# Patient Record
Sex: Female | Born: 1990 | Race: Black or African American | Hispanic: No | Smoking: Never smoker
Health system: Southern US, Community
[De-identification: ages and names within clinical notes are randomized; demographics above are authoritative.]

## PROBLEM LIST (undated history)

## (undated) DIAGNOSIS — R5383 Other fatigue: Principal | ICD-10-CM

## (undated) DIAGNOSIS — D649 Anemia, unspecified: Secondary | ICD-10-CM

## (undated) DIAGNOSIS — A64 Unspecified sexually transmitted disease: Secondary | ICD-10-CM

## (undated) DIAGNOSIS — R87619 Unspecified abnormal cytological findings in specimens from cervix uteri: Secondary | ICD-10-CM

## (undated) DIAGNOSIS — N83209 Unspecified ovarian cyst, unspecified side: Secondary | ICD-10-CM

## (undated) HISTORY — DX: Anemia, unspecified: D64.9

## (undated) HISTORY — PX: INTRAUTERINE DEVICE (IUD) INSERTION: SHX5877

## (undated) HISTORY — DX: Unspecified sexually transmitted disease: A64

## (undated) HISTORY — DX: Unspecified ovarian cyst, unspecified side: N83.209

## (undated) HISTORY — DX: Unspecified abnormal cytological findings in specimens from cervix uteri: R87.619

## (undated) HISTORY — PX: IUD REMOVAL: SHX5392

---

## 2015-03-21 ENCOUNTER — Ambulatory Visit: Payer: Self-pay | Admitting: Pediatrics

## 2015-03-28 ENCOUNTER — Ambulatory Visit: Payer: Self-pay | Admitting: Pediatrics

## 2015-03-29 ENCOUNTER — Encounter: Payer: Self-pay | Admitting: Pediatrics

## 2015-12-12 ENCOUNTER — Encounter (HOSPITAL_COMMUNITY): Payer: Self-pay

## 2015-12-12 ENCOUNTER — Emergency Department (HOSPITAL_COMMUNITY)
Admission: EM | Admit: 2015-12-12 | Discharge: 2015-12-12 | Disposition: A | Discharge status: Home / Self Care | Payer: Self-pay | Attending: Emergency Medicine | Admitting: Emergency Medicine

## 2015-12-12 DIAGNOSIS — N939 Abnormal uterine and vaginal bleeding, unspecified: Secondary | ICD-10-CM

## 2015-12-12 LAB — COMPREHENSIVE METABOLIC PANEL
ALK PHOS: 58 U/L (ref 38–126)
ALT: 18 U/L (ref 14–54)
AST: 21 U/L (ref 15–41)
Albumin: 4 g/dL (ref 3.5–5.0)
Anion gap: 6 (ref 5–15)
BILIRUBIN TOTAL: 0.6 mg/dL (ref 0.3–1.2)
BUN: 7 mg/dL (ref 6–20)
CALCIUM: 9.5 mg/dL (ref 8.9–10.3)
CHLORIDE: 103 mmol/L (ref 101–111)
CO2: 28 mmol/L (ref 22–32)
CREATININE: 0.71 mg/dL (ref 0.44–1.00)
Glucose, Bld: 90 mg/dL (ref 65–99)
Potassium: 3.8 mmol/L (ref 3.5–5.1)
Sodium: 137 mmol/L (ref 135–145)
TOTAL PROTEIN: 7.3 g/dL (ref 6.5–8.1)

## 2015-12-12 LAB — CBC WITH DIFFERENTIAL/PLATELET
BASOS ABS: 0 10*3/uL (ref 0.0–0.1)
Basophils Relative: 0 %
EOS ABS: 0 10*3/uL (ref 0.0–0.7)
EOS PCT: 1 %
HEMATOCRIT: 33.2 % — AB (ref 36.0–46.0)
HEMOGLOBIN: 10.8 g/dL — AB (ref 12.0–15.0)
LYMPHS ABS: 2.6 10*3/uL (ref 0.7–4.0)
Lymphocytes Relative: 57 %
MCH: 27.8 pg (ref 26.0–34.0)
MCHC: 32.5 g/dL (ref 30.0–36.0)
MCV: 85.6 fL (ref 78.0–100.0)
Monocytes Absolute: 0.4 10*3/uL (ref 0.1–1.0)
Monocytes Relative: 9 %
Neutro Abs: 1.5 10*3/uL — ABNORMAL LOW (ref 1.7–7.7)
Neutrophils Relative %: 33 %
PLATELETS: 283 10*3/uL (ref 150–400)
RBC: 3.88 MIL/uL (ref 3.87–5.11)
RDW: 14 % (ref 11.5–15.5)
WBC: 4.6 10*3/uL (ref 4.0–10.5)

## 2015-12-12 LAB — WET PREP, GENITAL
SPERM: NONE SEEN
TRICH WET PREP: NONE SEEN
YEAST WET PREP: NONE SEEN

## 2015-12-12 LAB — I-STAT BETA HCG BLOOD, ED (MC, WL, AP ONLY): I-stat hCG, quantitative: <5 m[IU]/mL (ref <5)

## 2015-12-12 MED ORDER — MEGESTROL ACETATE 40 MG PO TABS: 40.0000 mg | ORAL_TABLET | Freq: Every day | ORAL | Status: DC

## 2015-12-12 NOTE — ED Notes (Signed)
Pt reports heavy vaginal bleeding, onset June 5th and reports she changes her pad every 30 minutes. She states her menstrual period ended two weeks ago. She reports feeling lightheaded with abdominal cramping and headache. Ambulatory with steady gait, NAD

## 2015-12-12 NOTE — Discharge Instructions (Signed)
Follow up with your GYN or make an appointment with the [facility] if you do not have a GYN.  Abnormal Uterine Bleeding Abnormal uterine bleeding can affect women at various stages in life, including teenagers, women in their reproductive years, pregnant women, and women who have reached menopause. Several kinds of uterine bleeding are considered abnormal, including:  Bleeding or spotting between periods.   Bleeding after sexual intercourse.   Bleeding that is heavier or more than normal.   Periods that last longer than usual.  Bleeding after menopause.  Many cases of abnormal uterine bleeding are minor and simple to treat, while others are more serious. Any type of abnormal bleeding should be evaluated by your health care provider. Treatment will depend on the cause of the bleeding. HOME CARE INSTRUCTIONS Monitor your condition for any changes. The following actions may help to alleviate any discomfort you are experiencing:  Avoid the use of tampons and douches as directed by your health care provider.  Change your pads frequently. You should get regular pelvic exams and Pap tests. Keep all follow-up appointments for diagnostic tests as directed by your health care provider.  SEEK MEDICAL CARE IF:   Your bleeding lasts more than 1 week.   You feel dizzy at times.  SEEK IMMEDIATE MEDICAL CARE IF:   You pass out.   You are changing pads every 15 to 30 minutes.   You have abdominal pain.  You have a fever.   You become sweaty or weak.   You are passing large blood clots from the vagina.   You start to feel nauseous and vomit. MAKE SURE YOU:   Understand these instructions.  Will watch your condition.  Will get help right away if you are not doing well or get worse.   This information is not intended to replace advice given to you by your health care provider. Make sure you discuss any questions you have with your health care provider.     Document Released: 06/22/2005 Document Revised: 06/27/2013 Document Reviewed: 01/19/2013 Elsevier Interactive Patient Education [employer]2016 Elsevier Inc.

## 2015-12-12 NOTE — ED Provider Notes (Signed)
CSN: [id number]     Arrival date & time 12/12/15  1708 History   By signing my name below, I, Margaret Mora, attest that this documentation has been prepared under the direction and in the presence of Kerrie BuffaloHope Dlisa Barnwell, NP. Electronically Signed: Tanda RockersMargaux Mora, ED Scribe. 12/12/2015. 7:52 PM.     Chief Complaint  Patient presents with  . Vaginal Bleeding   Patient is a 25 y.o. female presenting with vaginal bleeding. The history is provided by the patient. No language interpreter was used.  Vaginal Bleeding Severity:  Severe Onset quality:  Gradual Duration:  4 days Timing:  Constant Progression:  Unchanged Chronicity:  Recurrent Menstrual history:  Regular Ineffective treatments: NSAIDs. Associated symptoms: abdominal pain and nausea    HPI Comments: Margaret LoryKatherine Metheny is a 25 y.o. female who presents to the Emergency Department complaining of vaginal bleeding with onset 4 days ago. She reports associated waxing and waning addominal pain that is exacerbated when getting out of bed. Pt notes she has taken motrin with mild relief of symptoms for the first 30 minutes to an hour. She reports having a normal period 2 weeks ago, but began bleeding again a week after her cycle ended. She notes that she has had similar symptoms of bleeding inbetween cycles for the past 3 months. Pt also complains of being nauseous when she is about to lay down or after drinking dairy for the past 2 days.Pt states she does not have a gynecologist. Her husband reports she is anemic. Pt states she is not on birth control. She denies frequency. G1 P1   History reviewed. No pertinent past medical history. History reviewed. No pertinent past surgical history. No family history on file. Social History  Substance Use Topics  . Smoking status: Never Smoker   . Smokeless tobacco: None  . Alcohol Use: None     Comment: occassionally   OB History    No data available     Review of Systems  Gastrointestinal: Positive for  nausea and abdominal pain.  Genitourinary: Positive for vaginal bleeding. Negative for frequency.  All other systems reviewed and are negative.  Allergies  Review of patient's allergies indicates not on file.  Home Medications   Prior to Admission medications   Not on File   BP 120/73 mmHg  Pulse 83  Temp(Src) 98.3 F (36.8 C) (Oral)  Resp 16  SpO2 98%  LMP 11/28/2015 Physical Exam  Constitutional: She is oriented to person, place, and time. She appears well-developed and well-nourished. No distress.  HENT:  Head: Normocephalic and atraumatic.  Eyes: Conjunctivae and EOM are normal.  Neck: Neck supple. No tracheal deviation present.  Cardiovascular: Normal rate and regular rhythm.   Pulmonary/Chest: Effort normal. No respiratory distress.  Abdominal: Soft. Bowel sounds are normal. There is no tenderness.  Genitourinary:  External genitalia without lesions, small blood vaginal vault. No CMT, no adnexal tenderness or mass palpated. Uterus without palpable enlargement.   Musculoskeletal: Normal range of motion.  Neurological: She is alert and oriented to person, place, and time.  Skin: Skin is warm and dry.  Psychiatric: She has a normal mood and affect. Her behavior is normal.  Nursing note and vitals reviewed.   ED Course  Procedures (including critical care time) DIAGNOSTIC STUDIES: Oxygen Saturation is 100%on RA, normal by my interpretation.    COORDINATION OF CARE: 7:06 PM-Discussed treatment plan which includes pelvic exam and going over labs with pt at bedside and pt agreed to plan.   Cultures for  GC and Chlamydia sent.  Labs Review Labs Reviewed  WET PREP, GENITAL - Abnormal; Notable for the following:    Clue Cells Wet Prep HPF POC PRESENT (*)    WBC, Wet Prep HPF POC MANY (*)    All other components within normal limits  CBC WITH DIFFERENTIAL/PLATELET - Abnormal; Notable for the following:    Hemoglobin 10.8 (*)    HCT 33.2 (*)    Neutro Abs 1.5 (*)     All other components within normal limits  COMPREHENSIVE METABOLIC PANEL  I-STAT BETA HCG BLOOD, ED ([facility], [facility], [facility] ONLY)  GC/CHLAMYDIA PROBE AMP ([place]) NOT AT [facility]    Imaging Review No results found. I have personally reviewed and evaluated thes lab results as part of my medical decision-making.   MDM  25 y.o. female with hx of irregular vaginal bleeding and heavy bleeding over the past 2 days stable for d/c with minimal bleeding at this time and stable hgb. And no abdominal pain on exam. Will start Megace and patient will make appointment with her GYN for follow up. She will go to [facility] for worsening symptoms.   Final diagnoses:  Abnormal vaginal bleeding   I personally performed the services described in this documentation, which was scribed in my presence. The recorded information has been reviewed and is accurate.    [place], NP 12/13/15 1153  Lorre Nick, MD 12/16/15 [phone]

## 2015-12-13 LAB — GC/CHLAMYDIA PROBE AMP (~~LOC~~) NOT AT ARMC
CHLAMYDIA, DNA PROBE: NEGATIVE
Neisseria Gonorrhea: NEGATIVE

## 2016-05-09 ENCOUNTER — Emergency Department (HOSPITAL_COMMUNITY)
Admission: EM | Admit: 2016-05-09 | Discharge: 2016-05-10 | Disposition: A | Discharge status: Home / Self Care | Payer: Medicaid Other | Attending: Emergency Medicine | Admitting: Emergency Medicine

## 2016-05-09 ENCOUNTER — Encounter: Payer: Self-pay | Admitting: Emergency Medicine

## 2016-05-09 DIAGNOSIS — N939 Abnormal uterine and vaginal bleeding, unspecified: Secondary | ICD-10-CM

## 2016-05-09 DIAGNOSIS — O23591 Infection of other part of genital tract in pregnancy, first trimester: Secondary | ICD-10-CM | POA: Insufficient documentation

## 2016-05-09 DIAGNOSIS — Z3A01 Less than 8 weeks gestation of pregnancy: Secondary | ICD-10-CM | POA: Diagnosis not present

## 2016-05-09 DIAGNOSIS — O0281 Inappropriate change in quantitative human chorionic gonadotropin (hCG) in early pregnancy: Secondary | ICD-10-CM | POA: Insufficient documentation

## 2016-05-09 DIAGNOSIS — N760 Acute vaginitis: Secondary | ICD-10-CM

## 2016-05-09 DIAGNOSIS — B9689 Other specified bacterial agents as the cause of diseases classified elsewhere: Secondary | ICD-10-CM | POA: Diagnosis not present

## 2016-05-09 DIAGNOSIS — O208 Other hemorrhage in early pregnancy: Secondary | ICD-10-CM | POA: Diagnosis present

## 2016-05-09 LAB — CBC WITH DIFFERENTIAL/PLATELET
Basophils Absolute: 0 10*3/uL (ref 0.0–0.1)
Basophils Relative: 0 %
Eosinophils Absolute: 0 10*3/uL (ref 0.0–0.7)
Eosinophils Relative: 1 %
HCT: 29.8 % — ABNORMAL LOW (ref 36.0–46.0)
Hemoglobin: 10 g/dL — ABNORMAL LOW (ref 12.0–15.0)
LYMPHS ABS: 2.3 10*3/uL (ref 0.7–4.0)
Lymphocytes Relative: 38 %
MCH: 28.2 pg (ref 26.0–34.0)
MCHC: 33.6 g/dL (ref 30.0–36.0)
MCV: 84.2 fL (ref 78.0–100.0)
Monocytes Absolute: 0.6 10*3/uL (ref 0.1–1.0)
Monocytes Relative: 9 %
Neutro Abs: 3.2 10*3/uL (ref 1.7–7.7)
Neutrophils Relative %: 52 %
PLATELETS: 243 10*3/uL (ref 150–400)
RBC: 3.54 MIL/uL — AB (ref 3.87–5.11)
RDW: 14.6 % (ref 11.5–15.5)
WBC: 6.2 10*3/uL (ref 4.0–10.5)

## 2016-05-09 LAB — BASIC METABOLIC PANEL
Anion gap: 7 (ref 5–15)
BUN: 9 mg/dL (ref 6–20)
CO2: 22 mmol/L (ref 22–32)
CREATININE: 0.52 mg/dL (ref 0.44–1.00)
Calcium: 9.2 mg/dL (ref 8.9–10.3)
Chloride: 103 mmol/L (ref 101–111)
GFR calc Af Amer: >60 mL/min (ref >60)
Glucose, Bld: 94 mg/dL (ref 65–99)
POTASSIUM: 3.4 mmol/L — AB (ref 3.5–5.1)
SODIUM: 132 mmol/L — AB (ref 135–145)

## 2016-05-09 LAB — URINALYSIS, ROUTINE W REFLEX MICROSCOPIC
BILIRUBIN URINE: NEGATIVE
GLUCOSE, UA: NEGATIVE mg/dL
KETONES UR: NEGATIVE mg/dL
LEUKOCYTES UA: NEGATIVE
Nitrite: NEGATIVE
PROTEIN: NEGATIVE mg/dL
Specific Gravity, Urine: 1.028 (ref 1.005–1.030)
pH: 6.5 (ref 5.0–8.0)

## 2016-05-09 LAB — WET PREP, GENITAL
SPERM: NONE SEEN
TRICH WET PREP: NONE SEEN
Yeast Wet Prep HPF POC: NONE SEEN

## 2016-05-09 LAB — URINE MICROSCOPIC-ADD ON

## 2016-05-09 LAB — HCG, QUANTITATIVE, PREGNANCY: HCG, BETA CHAIN, QUANT, S: 58151 m[IU]/mL — AB (ref <5)

## 2016-05-09 NOTE — ED Notes (Signed)
OB RN notified, of arrival of the pt since pt is not aware of when her LMP was, pt states she just found out a week ago that [facility] and they told her that she is only [redacted] weeks pregnant, OB RN encouraged to call if MD believe that she has 20 weeks or more pregnant and needs to be monitor.

## 2016-05-09 NOTE — ED Provider Notes (Signed)
[facility]-EM[facility] DEPT Provider Note   CSN: [id number] Arrival date & time: 05/09/16  1932     History   Chief Complaint No chief complaint on file.   HPI Margaret Mora is a 25 y.o. female.  The history is provided by the patient and medical records. No language interpreter was used.   Margaret Mora is a 25 y.o. female who presents to the Emergency Department complaining of vaginal bleeding that began today around noon. She was seen in outside hospital ED on 04/24/16 and told she was approximately [redacted] weeks pregnant but unsure of exactly how far along. She has irregular menses. She believes LMP was September. Patient states that she first noticed a small amount of bright red blood, shortly after she passed a golf ball sized clot that was darker in color with a foul smelling odor. She denies abdominal pain, nausea, vomiting, back pain, fevers, dysuria or any other associated symptoms. She has had one child SVD at term and one miscarriage that occurred in July of this year.    History reviewed. No pertinent past medical history.  There are no active problems to display for this patient.   History reviewed. No pertinent surgical history.  OB History    Gravida Para Term Preterm AB Living   3 1 1   1      SAB TAB Ectopic Multiple Live Births   1               Home Medications    Prior to Admission medications   Medication Sig Start Date End Date Taking? Authorizing Provider  metroNIDAZOLE (FLAGYL) 500 MG tablet Take 1 tablet (500 mg total) by mouth 2 (two) times daily. 05/10/16   Chase PicketJaime Pilcher Deadra Diggins, PA-C    Family History History reviewed. No pertinent family history.  Social History Social History  Substance Use Topics  . Smoking status: Never Smoker  . Smokeless tobacco: Never Used  . Alcohol use Yes     Comment: occassionally     Allergies   Patient has no known allergies.   Review of Systems Review of Systems  Constitutional: Negative for fever.  HENT:  Negative for congestion.   Eyes: Negative for visual disturbance.  Respiratory: Negative for cough and shortness of breath.   Cardiovascular: Negative.   Gastrointestinal: Negative for abdominal pain, nausea and vomiting.  Genitourinary: Positive for vaginal bleeding. Negative for dysuria and vaginal pain.  Musculoskeletal: Negative for back pain.  Skin: Negative for wound.  Neurological: Negative for headaches.     Physical Exam Updated Vital Signs BP 106/59 (BP Location: Right Arm)   Pulse 97   Temp 99.5 F (37.5 C) (Oral)   Resp 16   Ht 5\' 2"  (1.575 m)   Wt 68.9 kg   SpO2 100%   BMI 27.76 kg/m   Physical Exam  Constitutional: She is oriented to person, place, and time. She appears well-developed and well-nourished. No distress.  HENT:  Head: Normocephalic and atraumatic.  Cardiovascular: Normal rate, regular rhythm and normal heart sounds.   No murmur heard. Pulmonary/Chest: Effort normal and breath sounds normal. No respiratory distress.  Abdominal: Soft. Bowel sounds are normal. She exhibits no distension. There is no tenderness.  Genitourinary:  Genitourinary Comments: Slightly dilated cervical os < 1cm. There is bleeding in the vaginal vault, but no active bleeding from os. + Rhine discharge. No CMT. No adnexal tenderness, fullness or masses.   Neurological: She is alert and oriented to person, place, and time.  Skin:  Skin is warm and dry.  Nursing note and vitals reviewed.    ED Treatments / Results  Labs (all labs ordered are listed, but only abnormal results are displayed) Labs Reviewed  WET PREP, GENITAL - Abnormal; Notable for the following:       Result Value   Clue Cells Wet Prep HPF POC PRESENT (*)    WBC, Wet Prep HPF POC TOO NUMEROUS TO COUNT (*)    All other components within normal limits  CBC WITH DIFFERENTIAL/PLATELET - Abnormal; Notable for the following:    RBC 3.54 (*)    Hemoglobin 10.0 (*)    HCT 29.8 (*)    All other components within  normal limits  BASIC METABOLIC PANEL - Abnormal; Notable for the following:    Sodium 132 (*)    Potassium 3.4 (*)    All other components within normal limits  HCG, QUANTITATIVE, PREGNANCY - Abnormal; Notable for the following:    hCG, Beta Chain, Quant, S 58,151 (*)    All other components within normal limits  URINALYSIS, ROUTINE W REFLEX MICROSCOPIC (NOT AT [facility]) - Abnormal; Notable for the following:    APPearance CLOUDY (*)    Hgb urine dipstick MODERATE (*)    All other components within normal limits  URINE MICROSCOPIC-ADD ON - Abnormal; Notable for the following:    Squamous Epithelial / LPF 6-30 (*)    Bacteria, UA FEW (*)    All other components within normal limits  GC/CHLAMYDIA PROBE AMP ([place]) NOT AT [facility]    EKG  EKG Interpretation None       Radiology [place] Ob Comp Less 14 Wks  Result Date: 05/10/2016 CLINICAL DATA:  Vaginal bleeding, onset at noon. EXAM: OBSTETRIC <14 WK US AND TRANSVAGINAL OB US TECHNIQUE: Both transabdominal and transvaginal ultrasound examinations were performed for complete evaluation of the gestation as well as the maternal uterus, adnexal regions, and pelvic cul-de-sac. Transvaginal technique was performed to assess early pregnancy. COMPARISON:  None. FINDINGS: Intrauterine gestational sac: Single Yolk sac:  Visible Embryo:  Visible Cardiac Activity: Visible Heart Rate: 145  bpm MSD:   mm    w     d CRL:  11.8  mm   7 w   2 d                  US EDC: Subchorionic hemorrhage:  Small Maternal uterus/adnexae: Normal ovaries. No abnormal pelvic fluid collections. IMPRESSION: Single intrauterine gestation measuring 7 weeks 2 day by crown-rump length. Small subchorionic hemorrhage. Electronically Signed   By: Ellery Plunkaniel R Mitchell M.D.   On: 05/10/2016 01:24   [place] Ob Transvaginal  Result Date: 05/10/2016 CLINICAL DATA:  Vaginal bleeding, onset at noon. EXAM: OBSTETRIC <14 WK US AND TRANSVAGINAL OB US TECHNIQUE: Both transabdominal and transvaginal  ultrasound examinations were performed for complete evaluation of the gestation as well as the maternal uterus, adnexal regions, and pelvic cul-de-sac. Transvaginal technique was performed to assess early pregnancy. COMPARISON:  None. FINDINGS: Intrauterine gestational sac: Single Yolk sac:  Visible Embryo:  Visible Cardiac Activity: Visible Heart Rate: 145  bpm MSD:   mm    w     d CRL:  11.8  mm   7 w   2 d                  US EDC: Subchorionic hemorrhage:  Small Maternal uterus/adnexae: Normal ovaries. No abnormal pelvic fluid collections. IMPRESSION: Single intrauterine gestation measuring 7 weeks 2 day  by crown-rump length. Small subchorionic hemorrhage. Electronically Signed   By: Ellery Plunk M.D.   On: 05/10/2016 01:24    Procedures Procedures (including critical care time)  Medications Ordered in ED Medications - No data to display   Initial Impression / Assessment and Plan / ED Course  I have reviewed the triage vital signs and the nursing notes.  Pertinent labs & imaging results that were available during my care of the patient were reviewed by me and considered in my medical decision making (see chart for details).  Clinical Course    Taytem Ghattas is a 25 y.o. female who presents to ED for vaginal bleeding that began today. She believes she is approximately [redacted] weeks pregnant, but unsure as she has irregular menses and last menstrual cycle was in August or September. HCG of 58,151 today. Patient states that she had a small amount of vaginal bleeding that was bright red, she then passed a golf ball sized clot of a darker color just prior to arrival. On exam, patient with slightly dilated cervical os < 1cm. There is bleeding in the vaginal vault, but no active bleeding from os.   UA with + clue cells and TNTC WBC's - will treat with flagyl.   Ultrasound:  IMPRESSION: Single intrauterine gestation measuring 7 weeks 2 day by crown-rump length. Small subchorionic  hemorrhage.  Discussed ultrasound results with patient. A+ blood type will not need Rhogam. Discussed importance of follow up care with OBGYN. Reasons to return to ED discussed and all questions answered.   Patient discussed with Dr. Erma Heritage who agrees with treatment plan.   Final Clinical Impressions(s) / ED Diagnoses   Final diagnoses:  Vaginal bleeding  BV (bacterial vaginosis)    New Prescriptions Discharge Medication List as of 05/10/2016  1:39 AM    START taking these medications   Details  metroNIDAZOLE (FLAGYL) 500 MG tablet Take 1 tablet (500 mg total) by mouth 2 (two) times daily., Starting Sun 05/10/2016, Print         [facility] James Lafalce, PA-C 05/10/16 1618    Shaune Pollack, MD 05/11/16 [phone]

## 2016-05-09 NOTE — ED Triage Notes (Signed)
G3P1- vaginal delivery @ 40 weeks.  Pt is unsure of LMP, thinks sometime Aug or Sept, found out pregnant at [facility] 04-24-16, was told `4 weeks.  Onset noon vaginal bleeding, bright red, then had light pink blood when wiping.  On way to ED tonight pt passed large dark red blood clot.  No abd pain or cramping.  Does not have 1st OB appt set yet.

## 2016-05-09 NOTE — ED Notes (Signed)
Called main lab to inquire about hCG. Was told they are running it right now.

## 2016-05-10 ENCOUNTER — Emergency Department (HOSPITAL_COMMUNITY): Payer: Medicaid Other

## 2016-05-10 ENCOUNTER — Other Ambulatory Visit (HOSPITAL_COMMUNITY): Payer: Self-pay

## 2016-05-10 MED ORDER — METRONIDAZOLE 500 MG PO TABS: 500.0000 mg | ORAL_TABLET | Freq: Two times a day (BID) | ORAL | 0 refills | Status: DC

## 2016-05-10 NOTE — Discharge Instructions (Signed)
Please follow-up with your OB/GYN in regards to today's visit. You need to call Monday morning to schedule this follow-up appointment. Return to ER for new or worsening symptoms, any additional concerns.

## 2016-05-10 NOTE — ED Notes (Signed)
Called [facility] to inquire on the status of the ABO/RH. They stated they have not received it at all. Attempted to call phlebotomist, Melissa, who collected blood and got no response. Will attempt to locate phlebotomist.

## 2016-05-10 NOTE — ED Notes (Signed)
Patient transported to Ultrasound 

## 2016-05-11 LAB — GC/CHLAMYDIA PROBE AMP (~~LOC~~) NOT AT ARMC
Chlamydia: NEGATIVE
Neisseria Gonorrhea: NEGATIVE

## 2016-06-21 ENCOUNTER — Encounter (HOSPITAL_COMMUNITY): Payer: Self-pay | Admitting: Emergency Medicine

## 2016-06-21 ENCOUNTER — Emergency Department (HOSPITAL_COMMUNITY)
Admission: EM | Admit: 2016-06-21 | Discharge: 2016-06-21 | Disposition: A | Discharge status: Home / Self Care | Payer: Medicaid Other | Attending: Emergency Medicine | Admitting: Emergency Medicine

## 2016-06-21 DIAGNOSIS — O26892 Other specified pregnancy related conditions, second trimester: Secondary | ICD-10-CM | POA: Insufficient documentation

## 2016-06-21 DIAGNOSIS — Z3A15 15 weeks gestation of pregnancy: Secondary | ICD-10-CM

## 2016-06-21 DIAGNOSIS — R102 Pelvic and perineal pain: Secondary | ICD-10-CM | POA: Diagnosis not present

## 2016-06-21 LAB — WET PREP, GENITAL
CLUE CELLS WET PREP: NONE SEEN
TRICH WET PREP: NONE SEEN
Yeast Wet Prep HPF POC: NONE SEEN

## 2016-06-21 LAB — URINALYSIS, ROUTINE W REFLEX MICROSCOPIC
Bilirubin Urine: NEGATIVE
Glucose, UA: NEGATIVE mg/dL
HGB URINE DIPSTICK: NEGATIVE
KETONES UR: NEGATIVE mg/dL
NITRITE: NEGATIVE
PROTEIN: NEGATIVE mg/dL
Specific Gravity, Urine: 1.023 (ref 1.005–1.030)
pH: 6 (ref 5.0–8.0)

## 2016-06-21 LAB — PREGNANCY, URINE: PREG TEST UR: POSITIVE — AB

## 2016-06-21 MED ORDER — HYDROCODONE-ACETAMINOPHEN 5-325 MG PO TABS
1.0000 | ORAL_TABLET | Freq: Once | ORAL | Status: AC
  Administered 2016-06-21: 1 via ORAL
  Filled 2016-06-21: qty 1

## 2016-06-21 MED ORDER — HYDROCODONE-ACETAMINOPHEN 5-325 MG PO TABS
1.0000 | ORAL_TABLET | Freq: Four times a day (QID) | ORAL | 0 refills | Status: DC | PRN
Start: 2016-06-21 — End: 2019-09-01

## 2016-06-21 NOTE — ED Triage Notes (Signed)
Pt sts vaginal pain upon getting out of shower this am; pt sts is currently [redacted] weeks pregnant with no prenatal care yet; pt denies bleeding of discharge; pt unsure of LMP

## 2016-06-21 NOTE — ED Provider Notes (Addendum)
[facility] DE[facility] Provider Note   CSN: [id number] Arrival date & time: 06/21/16  1602     History   Chief Complaint Chief Complaint  Patient presents with  . Vaginal Pain    HPI Margaret Mora is a 25 y.o. female.   Patient is [redacted] weeks pregnant. First OB appointment is next week. Patient was taking a shower today sudden burning sensation in external genitalia and vaginal area. No bleeding no abdominal pain no discharge. Patient felt fine earlier. Patient was using a different soap. But states she's never had anything like this ever happened before. States the burning sensation associated grades difficult to walk.      History reviewed. No pertinent past medical history.  There are no active problems to display for this patient.   History reviewed. No pertinent surgical history.  OB History    Gravida Para Term Preterm AB Living   3 1 1   1      SAB TAB Ectopic Multiple Live Births   1               Home Medications    Prior to Admission medications   Medication Sig Start Date End Date Taking? Authorizing Provider  Prenatal Vit-Fe Fumarate-FA (PRENATAL MULTIVITAMIN) TABS tablet Take 1 tablet by mouth every evening.   Yes Historical Provider, MD  HYDROcodone-acetaminophen (NORCO/VICODIN) 5-325 MG tablet Take 1-2 tablets by mouth every 6 (six) hours as needed for moderate pain. 06/21/16   Vanetta MuldersScott Ming Kunka, MD    Family History History reviewed. No pertinent family history.  Social History Social History  Substance Use Topics  . Smoking status: Never Smoker  . Smokeless tobacco: Never Used  . Alcohol use Yes     Comment: occassionally     Allergies   Patient has no known allergies.   Review of Systems Review of Systems  Constitutional: Negative for fever.  HENT: Negative for congestion.   Respiratory: Negative for shortness of breath.   Cardiovascular: Positive for palpitations. Negative for chest pain.  Gastrointestinal: Negative for abdominal  pain.  Genitourinary: Positive for vaginal pain. Negative for dysuria, vaginal bleeding and vaginal discharge.  Musculoskeletal: Negative for back pain.  Skin: Negative for rash.  Neurological: Negative for headaches.  Hematological: Bruises/bleeds easily.  Psychiatric/Behavioral: Negative for confusion.     Physical Exam Updated Vital Signs BP 112/70   Pulse 102   Temp 98.1 F (36.7 C) (Oral)   Resp 17   SpO2 100%   Physical Exam  Constitutional: She is oriented to person, place, and time. She appears well-developed and well-nourished. No distress.  HENT:  Head: Normocephalic and atraumatic.  Mouth/Throat: Oropharynx is clear and moist.  Eyes: Conjunctivae and EOM are normal. Pupils are equal, round, and reactive to light.  Neck: Normal range of motion. Neck supple.  Cardiovascular: Normal rate and regular rhythm.   Pulmonary/Chest: Effort normal and breath sounds normal. No respiratory distress.  Abdominal: Soft. Bowel sounds are normal. There is no tenderness.  Genitourinary: Vagina normal and uterus normal. No vaginal discharge found.  Genitourinary Comments: No cervical motion tenderness.  Musculoskeletal: Normal range of motion. She exhibits no edema.  Neurological: She is alert and oriented to person, place, and time. No cranial nerve deficit or sensory deficit. She exhibits normal muscle tone. Coordination normal.  Skin: Skin is warm. No erythema.  Nursing note and vitals reviewed.    ED Treatments / Results  Labs (all labs ordered are listed, but only abnormal results are displayed) Labs Reviewed  WET PREP, GENITAL - Abnormal; Notable for the following:       Result Value   WBC, Wet Prep HPF POC MODERATE (*)    All other components within normal limits  URINALYSIS, ROUTINE W REFLEX MICROSCOPIC - Abnormal; Notable for the following:    APPearance HAZY (*)    Leukocytes, UA MODERATE (*)    Bacteria, UA RARE (*)    Squamous Epithelial / LPF 6-30 (*)    All  other components within normal limits  PREGNANCY, URINE - Abnormal; Notable for the following:    Preg Test, Ur POSITIVE (*)    All other components within normal limits  RPR  GC/CHLAMYDIA PROBE AMP ([place]) NOT AT [facility]    EKG  EKG Interpretation None       Radiology No results found.  Procedures Procedures (including critical care time)  Medications Ordered in ED Medications  HYDROcodone-acetaminophen (NORCO/VICODIN) 5-325 MG per tablet 1 tablet (1 tablet Oral Given 06/21/16 1855)     Initial Impression / Assessment and Plan / ED Course  I have reviewed the triage vital signs and the nursing notes.  Pertinent labs & imaging results that were available during my care of the patient were reviewed by me and considered in my medical decision making (see chart for details).  Clinical Course     Patient is currently [redacted] weeks pregnant. Patient was taking shower today gets sudden intense burning on the external skin or and in the vaginal area.   Seems to be consistent with possible chemical irritant. Pelvic exam shows no external skin irritation or any vaginal abnormalities. Wet prep without any significant abnormalities. No vaginal bleeding no significant vaginal discharge no abdominal pain. No concern for threatened miscarriage. Patient is taking prenatal vitamins does have follow-up with OB/GYN next week. Will treat of with hydrocodone for pain control. Have patient follow-up women's clinic for any new or worse symptoms.  Discussed with Carolanne Grumblingracy Turner from cardiology. Recommends having hospitalists admit her for formal rule out and [place] Anaracy will make note that patient will be an inpatient and no plan to do the battery placement tomorrow if she rules out.   Final Clinical Impressions(s) / ED Diagnoses   Final diagnoses:  Vaginal pain  [redacted] weeks gestation of pregnancy    New Prescriptions New Prescriptions   HYDROCODONE-ACETAMINOPHEN (NORCO/VICODIN) 5-325 MG TABLET    Take  1-2 tablets by mouth every 6 (six) hours as needed for moderate pain.     Vanetta MuldersScott Maclovio Henson, MD 06/21/16 2025    Vanetta MuldersScott Yeily Link, MD 06/21/16 2029

## 2016-06-21 NOTE — Discharge Instructions (Signed)
Follow-up with her OB/GYN doctor or [facility] [address] as needed. Vaginal cultures are pending you be called if there is anything abnormal. Take the hydrocodone as needed for the pain. Work note provided.

## 2016-06-21 NOTE — ED Notes (Signed)
Pt states she is [redacted] weeks pregnant.

## 2016-06-22 LAB — GC/CHLAMYDIA PROBE AMP (~~LOC~~) NOT AT ARMC
Chlamydia: NEGATIVE
NEISSERIA GONORRHEA: NEGATIVE

## 2016-06-22 LAB — RPR: RPR: NONREACTIVE

## 2018-06-10 ENCOUNTER — Emergency Department (HOSPITAL_COMMUNITY)
Admission: EM | Admit: 2018-06-10 | Discharge: 2018-06-10 | Disposition: A | Discharge status: Home / Self Care | Payer: Self-pay | Attending: Emergency Medicine | Admitting: Emergency Medicine

## 2018-06-10 ENCOUNTER — Other Ambulatory Visit: Payer: Self-pay

## 2018-06-10 ENCOUNTER — Encounter (HOSPITAL_COMMUNITY): Payer: Self-pay

## 2018-06-10 ENCOUNTER — Emergency Department (HOSPITAL_COMMUNITY): Payer: Self-pay

## 2018-06-10 DIAGNOSIS — R519 Headache, unspecified: Secondary | ICD-10-CM

## 2018-06-10 DIAGNOSIS — H538 Other visual disturbances: Secondary | ICD-10-CM | POA: Insufficient documentation

## 2018-06-10 DIAGNOSIS — Z79899 Other long term (current) drug therapy: Secondary | ICD-10-CM | POA: Insufficient documentation

## 2018-06-10 DIAGNOSIS — R51 Headache: Secondary | ICD-10-CM | POA: Insufficient documentation

## 2018-06-10 LAB — CBC WITH DIFFERENTIAL/PLATELET
ABS IMMATURE GRANULOCYTES: 0.01 10*3/uL (ref 0.00–0.07)
BASOS PCT: 0 %
Basophils Absolute: 0 10*3/uL (ref 0.0–0.1)
Eosinophils Absolute: 0.1 10*3/uL (ref 0.0–0.5)
Eosinophils Relative: 1 %
HCT: 38.8 % (ref 36.0–46.0)
HEMOGLOBIN: 12.3 g/dL (ref 12.0–15.0)
IMMATURE GRANULOCYTES: 0 %
LYMPHS PCT: 49 %
Lymphs Abs: 2.4 10*3/uL (ref 0.7–4.0)
MCH: 28.3 pg (ref 26.0–34.0)
MCHC: 31.7 g/dL (ref 30.0–36.0)
MCV: 89.2 fL (ref 80.0–100.0)
MONOS PCT: 10 %
Monocytes Absolute: 0.5 10*3/uL (ref 0.1–1.0)
NEUTROS ABS: 2 10*3/uL (ref 1.7–7.7)
NEUTROS PCT: 40 %
Platelets: 294 10*3/uL (ref 150–400)
RBC: 4.35 MIL/uL (ref 3.87–5.11)
RDW: 14.4 % (ref 11.5–15.5)
WBC: 4.9 10*3/uL (ref 4.0–10.5)
nRBC: 0 % (ref 0.0–0.2)

## 2018-06-10 LAB — BASIC METABOLIC PANEL
ANION GAP: 9 (ref 5–15)
BUN: 8 mg/dL (ref 6–20)
CALCIUM: 9.7 mg/dL (ref 8.9–10.3)
CO2: 28 mmol/L (ref 22–32)
CREATININE: 0.6 mg/dL (ref 0.44–1.00)
Chloride: 105 mmol/L (ref 98–111)
GFR calc Af Amer: >60 mL/min (ref >60)
Glucose, Bld: 83 mg/dL (ref 70–99)
POTASSIUM: 3.6 mmol/L (ref 3.5–5.1)
SODIUM: 142 mmol/L (ref 135–145)

## 2018-06-10 LAB — RAPID URINE DRUG SCREEN, HOSP PERFORMED
AMPHETAMINES: NOT DETECTED
BARBITURATES: NOT DETECTED
Benzodiazepines: NOT DETECTED
Cocaine: NOT DETECTED
Opiates: NOT DETECTED
TETRAHYDROCANNABINOL: NOT DETECTED

## 2018-06-10 LAB — URINALYSIS, ROUTINE W REFLEX MICROSCOPIC
BILIRUBIN URINE: NEGATIVE
GLUCOSE, UA: NEGATIVE mg/dL
HGB URINE DIPSTICK: NEGATIVE
Ketones, ur: NEGATIVE mg/dL
Leukocytes, UA: NEGATIVE
NITRITE: NEGATIVE
PH: 6 (ref 5.0–8.0)
Protein, ur: NEGATIVE mg/dL
SPECIFIC GRAVITY, URINE: 1.004 — AB (ref 1.005–1.030)

## 2018-06-10 LAB — POC URINE PREG, ED: Preg Test, Ur: NEGATIVE

## 2018-06-10 MED ORDER — METOCLOPRAMIDE HCL 5 MG/ML IJ SOLN
10.0000 mg | Freq: Once | INTRAMUSCULAR | Status: AC
  Administered 2018-06-10: 10 mg via INTRAVENOUS
  Filled 2018-06-10: qty 2

## 2018-06-10 MED ORDER — SODIUM CHLORIDE 0.9 % IV BOLUS
1000.0000 mL | Freq: Once | INTRAVENOUS | Status: AC
  Administered 2018-06-10: 1000 mL via INTRAVENOUS

## 2018-06-10 MED ORDER — DIPHENHYDRAMINE HCL 50 MG/ML IJ SOLN
25.0000 mg | Freq: Once | INTRAMUSCULAR | Status: AC
  Administered 2018-06-10: 25 mg via INTRAVENOUS
  Filled 2018-06-10: qty 1

## 2018-06-10 NOTE — ED Provider Notes (Signed)
[facility]-EMERGENCY DEPT Provider Note   CSN: [id number] Arrival date & time: 06/10/18  1256     History   Chief Complaint Chief Complaint  Patient presents with  . Headache    HPI Margaret Mora is a 27 y.o. female.  HPI 27 year old female, with PMH of headaches and anemia, presents with a headache and vision changes.  Patient states symptoms started suddenly around 10am with 3 episodes of blurry vision.  She states then that she had a second of her vision going black.  She denies passing out, falling.  She states headache started after episodes of blurry vision and feels like a pressure over her entire head.  She describes headache is reaching maximum intensity and 3 minutes.  She states headache today feels different than previous headaches because usually they are like a drill, not a pressure.  She denies any visual changes currently.  She denies any vomiting, LOC, numbness, tingling, weakness.  She denies any fevers, cough, rhinorrhea, neck stiffness, neck pain.  History reviewed. No pertinent past medical history.  There are no active problems to display for this patient.   History reviewed. No pertinent surgical history.   OB History    Gravida  3   Para  1   Term  1   Preterm      AB  1   Living        SAB  1   TAB      Ectopic      Multiple      Live Births               Home Medications    Prior to Admission medications   Medication Sig Start Date End Date Taking? Authorizing Provider  HYDROcodone-acetaminophen (NORCO/VICODIN) 5-325 MG tablet Take 1-2 tablets by mouth every 6 (six) hours as needed for moderate pain. 06/21/16   Vanetta Mulders, MD  Prenatal Vit-Fe Fumarate-FA (PRENATAL MULTIVITAMIN) TABS tablet Take 1 tablet by mouth every evening.    [provider]    Family History No family history on file.  Social History Social History   Tobacco Use  . Smoking status: Never Smoker  . Smokeless  tobacco: Never Used  Substance Use Topics  . Alcohol use: Yes    Comment: occassionally  . Drug use: No     Allergies   Patient has no known allergies.   Review of Systems Review of Systems  Constitutional: Negative for chills and fever.  HENT: Negative for rhinorrhea and sore throat.   Eyes: Positive for visual disturbance.  Respiratory: Negative for cough and shortness of breath.   Cardiovascular: Negative for chest pain and leg swelling.  Gastrointestinal: Negative for abdominal pain, diarrhea, nausea and vomiting.  Genitourinary: Negative for dysuria, frequency and urgency.  Musculoskeletal: Negative for joint swelling and neck pain.  Skin: Negative for rash and wound.  Neurological: Positive for headaches. Negative for dizziness, syncope, speech difficulty and numbness.  All other systems reviewed and are negative.    Physical Exam Updated Vital Signs BP (!) 139/92 (BP Location: Left Arm)   Pulse 75   Temp 97.7 F (36.5 C) (Oral)   Resp 16   Ht 5\' 2"  (1.575 m)   Wt 73.9 kg   LMP 05/11/2018   SpO2 100%   BMI 29.81 kg/m   Physical Exam  Constitutional: She is oriented to person, place, and time. She appears well-developed and well-nourished.  HENT:  Head: Normocephalic and atraumatic.  Eyes:  Conjunctivae and EOM are normal.  Neck: Neck supple.  Cardiovascular: Normal rate, regular rhythm and normal heart sounds.  No murmur heard. Pulmonary/Chest: Effort normal and breath sounds normal. No respiratory distress. She has no wheezes. She has no rales.  Abdominal: Soft. Bowel sounds are normal. She exhibits no distension. There is no tenderness.  Musculoskeletal: Normal range of motion. She exhibits no tenderness or deformity.  Neurological: She is alert and oriented to person, place, and time. She has normal strength. No cranial nerve deficit or sensory deficit. She displays a negative Romberg sign. GCS eye subscore is 4. GCS verbal subscore is 5. GCS motor  subscore is 6.  Skin: Skin is warm and dry. No rash noted. No erythema.  Psychiatric: She has a normal mood and affect. Her behavior is normal.  Nursing note and vitals reviewed.    ED Treatments / Results  Labs (all labs ordered are listed, but only abnormal results are displayed) Labs Reviewed  URINALYSIS, ROUTINE W REFLEX MICROSCOPIC - Abnormal; Notable for the following components:      Result Value   Color, Urine STRAW (*)    Specific Gravity, Urine 1.004 (*)    Bacteria, UA RARE (*)    All other components within normal limits  CBC WITH DIFFERENTIAL/PLATELET  BASIC METABOLIC PANEL  RAPID URINE DRUG SCREEN, HOSP PERFORMED  POC URINE PREG, ED    EKG None  Radiology Ct Head Wo Contrast  Result Date: 06/10/2018 CLINICAL DATA:  Severe headache EXAM: CT HEAD WITHOUT CONTRAST TECHNIQUE: Contiguous axial images were obtained from the base of the skull through the vertex without intravenous contrast. COMPARISON:  None. FINDINGS: Brain: No evidence of acute infarction, hemorrhage, hydrocephalus, extra-axial collection or mass lesion/mass effect. Vascular: No hyperdense vessel or unexpected calcification. Skull: Normal. Negative for fracture or focal lesion. Sinuses/Orbits: The visualized paranasal sinuses are essentially clear. The mastoid air cells are unopacified. Other: None. IMPRESSION: Normal head CT. Electronically Signed   By: Charline BillsSriyesh  Krishnan M.D.   On: 06/10/2018 15:54    Procedures Procedures (including critical care time)  Medications Ordered in ED Medications  sodium chloride 0.9 % bolus 1,000 mL (1,000 mLs Intravenous Bolus 06/10/18 1645)  metoCLOPramide (REGLAN) injection 10 mg (10 mg Intravenous Given 06/10/18 1644)  diphenhydrAMINE (BENADRYL) injection 25 mg (25 mg Intravenous Given 06/10/18 1644)     Initial Impression / Assessment and Plan / ED Course  I have reviewed the triage vital signs and the nursing notes.  Pertinent labs & imaging results that were  available during my care of the patient were reviewed by me and considered in my medical decision making (see chart for details).     Patient resting comfortably in bed, no acute distress, nontoxic, non-lethargic.  Vital signs stable.  Patient denies any headache at this time.  Continues to deny any visual changes while in the ED.  Her neuro exam is very reassuring with a nonfocal, nonlateralizing neuro exam.  Patient initially described headache as reaching maximum intensity within 3 minutes.  CT head was obtained to rule out subarachnoid.  It was obtained within the first 6 hours of her symptom onset.  It showed no acute abnormalities.  Blood work unremarkable, urine pregnancy negative, UDS negative, urine shows no UTI.  No evidence of meningitis, no meningeal signs, patient afebrile with no URI symptoms.  Patient symptoms likely due to a complex migraine.  Encourage close follow-up with primary care.  Given strict return precautions.  Ready and stable for discharge  At this time there does not appear to be any evidence of an acute emergency medical condition and the patient appears stable for discharge with appropriate outpatient follow up.Diagnosis was discussed with patient who verbalizes understanding and is agreeable to discharge. Pt case discussed with Dr. Charm Barges who agrees with my plan.   Final Clinical Impressions(s) / ED Diagnoses   Final diagnoses:  None    ED Discharge Orders    None       Rueben Bash 06/10/18 2227    Terrilee Files, MD 06/11/18 [phone]

## 2018-06-10 NOTE — ED Triage Notes (Signed)
Pt states that she was at work today when she could no longer see the screen and started to taste metal. Pt states that she then developed a headache. Pt states that she has a hx of dehydration, but been trying to stay on top of water consumption, and does not want a repeat of last time.

## 2018-06-10 NOTE — Discharge Instructions (Addendum)
Your work-up today was very reassuring.  Your CT head was normal and blood work was unremarkable.  Please follow-up with a primary care provider within 5 days for continued evaluation.  Please return to the ED immediately for new or worsening symptoms, such as return of your symptoms, visual changes, numbness, tingling, fevers, neck stiffness or any concerns at all.

## 2018-09-18 ENCOUNTER — Other Ambulatory Visit: Payer: Self-pay

## 2018-09-18 ENCOUNTER — Emergency Department (HOSPITAL_COMMUNITY): Payer: Self-pay

## 2018-09-18 ENCOUNTER — Emergency Department (HOSPITAL_COMMUNITY)
Admission: EM | Admit: 2018-09-18 | Discharge: 2018-09-18 | Disposition: A | Discharge status: Home / Self Care | Payer: Self-pay | Attending: Emergency Medicine | Admitting: Emergency Medicine

## 2018-09-18 ENCOUNTER — Encounter (HOSPITAL_COMMUNITY): Payer: Self-pay | Admitting: Emergency Medicine

## 2018-09-18 DIAGNOSIS — R42 Dizziness and giddiness: Secondary | ICD-10-CM | POA: Insufficient documentation

## 2018-09-18 DIAGNOSIS — R1031 Right lower quadrant pain: Secondary | ICD-10-CM | POA: Insufficient documentation

## 2018-09-18 DIAGNOSIS — Z79899 Other long term (current) drug therapy: Secondary | ICD-10-CM | POA: Insufficient documentation

## 2018-09-18 DIAGNOSIS — N939 Abnormal uterine and vaginal bleeding, unspecified: Secondary | ICD-10-CM | POA: Insufficient documentation

## 2018-09-18 LAB — CBC WITH DIFFERENTIAL/PLATELET
ABS IMMATURE GRANULOCYTES: 0 10*3/uL (ref 0.00–0.07)
Basophils Absolute: 0 10*3/uL (ref 0.0–0.1)
Basophils Relative: 0 %
Eosinophils Absolute: 0 10*3/uL (ref 0.0–0.5)
Eosinophils Relative: 1 %
HCT: 37 % (ref 36.0–46.0)
Hemoglobin: 11.7 g/dL — ABNORMAL LOW (ref 12.0–15.0)
Immature Granulocytes: 0 %
Lymphocytes Relative: 41 %
Lymphs Abs: 1.9 10*3/uL (ref 0.7–4.0)
MCH: 27.6 pg (ref 26.0–34.0)
MCHC: 31.6 g/dL (ref 30.0–36.0)
MCV: 87.3 fL (ref 80.0–100.0)
MONO ABS: 0.6 10*3/uL (ref 0.1–1.0)
Monocytes Relative: 12 %
Neutro Abs: 2.1 10*3/uL (ref 1.7–7.7)
Neutrophils Relative %: 46 %
Platelets: 263 10*3/uL (ref 150–400)
RBC: 4.24 MIL/uL (ref 3.87–5.11)
RDW: 15 % (ref 11.5–15.5)
WBC: 4.6 10*3/uL (ref 4.0–10.5)
nRBC: 0 % (ref 0.0–0.2)

## 2018-09-18 LAB — URINALYSIS, ROUTINE W REFLEX MICROSCOPIC
Bilirubin Urine: NEGATIVE
Glucose, UA: NEGATIVE mg/dL
Ketones, ur: NEGATIVE mg/dL
Leukocytes,Ua: NEGATIVE
Nitrite: NEGATIVE
Protein, ur: NEGATIVE mg/dL
Specific Gravity, Urine: 1.012 (ref 1.005–1.030)
pH: 7 (ref 5.0–8.0)

## 2018-09-18 LAB — POC URINE PREG, ED: Preg Test, Ur: NEGATIVE

## 2018-09-18 LAB — WET PREP, GENITAL
Clue Cells Wet Prep HPF POC: NONE SEEN
Sperm: NONE SEEN
Trich, Wet Prep: NONE SEEN

## 2018-09-18 LAB — BASIC METABOLIC PANEL
Anion gap: 7 (ref 5–15)
BUN: 9 mg/dL (ref 6–20)
CO2: 23 mmol/L (ref 22–32)
Calcium: 9 mg/dL (ref 8.9–10.3)
Chloride: 105 mmol/L (ref 98–111)
Creatinine, Ser: 0.66 mg/dL (ref 0.44–1.00)
GFR calc Af Amer: >60 mL/min (ref >60)
GFR calc non Af Amer: >60 mL/min (ref >60)
Glucose, Bld: 89 mg/dL (ref 70–99)
Potassium: 3.8 mmol/L (ref 3.5–5.1)
Sodium: 135 mmol/L (ref 135–145)

## 2018-09-18 MED ORDER — ONDANSETRON 4 MG PO TBDP
4.0000 mg | ORAL_TABLET | Freq: Once | ORAL | Status: AC
  Administered 2018-09-18: 4 mg via ORAL
  Filled 2018-09-18: qty 1

## 2018-09-18 MED ORDER — IBUPROFEN 400 MG PO TABS
600.0000 mg | ORAL_TABLET | Freq: Once | ORAL | Status: AC
  Administered 2018-09-18: 600 mg via ORAL
  Filled 2018-09-18: qty 1

## 2018-09-18 MED ORDER — LIDOCAINE HCL (PF) 1 % IJ SOLN
INTRAMUSCULAR | Status: AC
  Administered 2018-09-18: 2 mL
  Filled 2018-09-18: qty 5

## 2018-09-18 MED ORDER — CEFTRIAXONE SODIUM 250 MG IJ SOLR
250.0000 mg | Freq: Once | INTRAMUSCULAR | Status: AC
  Administered 2018-09-18: 250 mg via INTRAMUSCULAR
  Filled 2018-09-18: qty 250

## 2018-09-18 MED ORDER — AZITHROMYCIN 250 MG PO TABS
1000.0000 mg | ORAL_TABLET | Freq: Once | ORAL | Status: AC
  Administered 2018-09-18: 1000 mg via ORAL
  Filled 2018-09-18: qty 4

## 2018-09-18 MED ORDER — FLUCONAZOLE 150 MG PO TABS
150.0000 mg | ORAL_TABLET | Freq: Once | ORAL | Status: AC
  Administered 2018-09-18: 150 mg via ORAL
  Filled 2018-09-18: qty 1

## 2018-09-18 NOTE — Discharge Instructions (Addendum)
You have been seen today for vaginal bleeding, lightheadedness, and abdominal pain. Please read and follow all provided instructions.   1. Medications: usual home medications 2. Treatment: rest, drink plenty of fluids, eat regular meals 3. Follow Up: Please follow up with your primary doctor in 2 days for discussion of your diagnoses and further evaluation after today's visit; if you do not have a primary care doctor use the resource guide provided to find one; Please return to the ER for any new or worsening symptoms. Please obtain all of your results from medical records or have your doctors office obtain the results - share them with your doctor - you should be seen at your doctors office. Call today to arrange your follow up.   Take medications as prescribed. Please review all of the medicines and only take them if you do not have an allergy to them. Return to the emergency room for worsening condition or new concerning symptoms. Follow up with your regular doctor. If you don't have a regular doctor use one of the numbers below to establish a primary care doctor.  Please be aware that if you are taking birth control pills, taking other prescriptions, ESPECIALLY ANTIBIOTICS may make the birth control ineffective - if this is the case, either do not engage in sexual activity or use alternative methods of birth control such as condoms until you have finished the medicine and your family doctor says it is OK to restart them. If you are on a blood thinner such as COUMADIN, be aware that any other medicine that you take may cause the coumadin to either work too much, or not enough - you should have your coumadin level rechecked in next 7 days if this is the case.  ?  It is also a possibility that you have an allergic reaction to any of the medicines that you have been prescribed - Everybody reacts differently to medications and while MOST people have no trouble with most medicines, you may have a reaction  such as nausea, vomiting, rash, swelling, shortness of breath. If this is the case, please stop taking the medicine immediately and contact your physician.  ?  You should return to the ER if you develop severe or worsening symptoms.   Emergency Department Resource Guide 1) Find a Doctor and Pay Out of Pocket Although you won't have to find out who is covered by your insurance plan, it is a good idea to ask around and get recommendations. You will then need to call the office and see if the doctor you have chosen will accept you as a new patient and what types of options they offer for patients who are self-pay. Some doctors offer discounts or will set up payment plans for their patients who do not have insurance, but you will need to ask so you aren't surprised when you get to your appointment.  2) Contact Your Local Health Department Not all health departments have doctors that can see patients for sick visits, but many do, so it is worth a call to see if yours does. If you don't know where your local health department is, you can check in your phone book. The CDC also has a tool to help you locate your state's health department, and many state websites also have listings of all of their local health departments.  3) Find a Walk-in Clinic If your illness is not likely to be very severe or complicated, you may want to try a walk in  clinic. These are popping up all over the country in pharmacies, drugstores, and shopping centers. They're usually staffed by nurse practitioners or physician assistants that have been trained to treat common illnesses and complaints. They're usually fairly quick and inexpensive. However, if you have serious medical issues or chronic medical problems, these are probably not your best option.  No Primary Care Doctor: Call Health Connect at  [phone] - they can help you locate a primary care doctor that  accepts your insurance, provides certain services, etc. Physician  Referral [phone]  Emergency Department Resource Guide 1) Find a Doctor and Pay Out of Pocket Although you won't have to find out who is covered by your insurance plan, it is a good idea to ask around and get recommendations. You will then need to call the office and see if the doctor you have chosen will accept you as a new patient and what types of options they offer for patients who are self-pay. Some doctors offer discounts or will set up payment plans for their patients who do not have insurance, but you will need to ask so you aren't surprised when you get to your appointment.  2) Contact Your Local Health Department Not all health departments have doctors that can see patients for sick visits, but many do, so it is worth a call to see if yours does. If you don't know where your local health department is, you can check in your phone book. The CDC also has a tool to help you locate your state's health department, and many state websites also have listings of all of their local health departments.  3) Find a [facility] If your illness is not likely to be very severe or complicated, you may want to try a walk in clinic. These are popping up all over the country in pharmacies, drugstores, and shopping centers. They're usually staffed by nurse practitioners or physician assistants that have been trained to treat common illnesses and complaints. They're usually fairly quick and inexpensive. However, if you have serious medical issues or chronic medical problems, these are probably not your best option.  No Primary Care Doctor: Call Health Connect at  [phone] - they can help you locate a primary care doctor that  accepts your insurance, provides certain services, etc. Physician Referral Service- [phone]  Chronic Pain Problems: Organization         Address  Phone   Notes  [facility]  [phone] Patients need to be referred by their primary care  doctor.   Medication Assistance: Organization         Address  Phone   Notes  [facility] Medication [facility] [address] [place], [place] [address] [phone] --Must be a resident of [facility] -- Must have NO insurance coverage whatsoever (no Medicaid/ Medicare, etc.) -- The pt. MUST have a primary care doctor that directs their care regularly and follows them in the community   [facility]  [phone]   [employer]  [phone]    Agencies that provide inexpensive medical care: Organization         Address  Phone   Notes  [place]  [phone]   [facility]    [phone]   [facility] [place], [place] [address] [phone]   [place] [address], [place] [phone]   Planned Parenthood    (  [phone]   [facility]    [phone]   [facility]  [address], [place] Phone:  [phone], Fax:  [phone] Hours of Operation:  9 am - 6 pm, M-F.  Also accepts Medicaid/Medicare and self-pay.  [facility] for [place], [address], [place] Phone: [phone], Fax: [phone]. Hours of Operation:  8:30 am - 5:30 pm, M-F.  Also accepts Medicaid and self-pay.  [facility] [place] [address], [place] Phone: [phone]   [place], [place], [place] [phone], Ext. [phone] Mondays & Thursdays: 7-9 AM.  First 15 patients are seen on a first come, first serve basis.    [place] Providers:  Organization         Address  Phone   Notes  [facility] [address], [place] [phone] Also accepts self-pay patients.  [facility] [address], [place]  [phone]   [place], [address], [place] [phone]   [facility] [address] [place] [phone]   Lucianne Lei [address], [place]   [phone] Only accepts [place] [facility] [place] patients after they have their name applied to their card.   Self-Pay (no insurance) in [facility]:  Organization         Address  Phone   Notes  Sickle Cell Patients, [facility] [place] [phone]   [facility] [place] [phone]   [facility] [place]  [place], [place], [place] [phone]   [facility]/Dr. Osei-Bonsu  [address], [place] or [address], [place] [phone] Phone number for both [place] and [place] locations is the same.  [facility] and [facility] [address], [place] [phone]   [facility] [address], [place] or [address] [phone]   [facility] [address] [place] [phone], phone; [phone], fax Sees patients 1st and 3rd Saturday of every month.  Must not qualify for public or private insurance (i.e. Medicaid, Medicare, [place] Health Choice, Veterans' Benefits)  Household income should be no more than 200% of the poverty level The clinic cannot treat you if you are pregnant or think you are pregnant  Sexually transmitted diseases are not treated at the clinic.

## 2018-09-18 NOTE — ED Triage Notes (Addendum)
About 30 mins ago States came downstairs for breakfast  And felt  Lightheaded and sob  Had some some vag bleeding , LMP was week  And got nauseated had a stuffy nose and she had cramps,  Birth control IUD, got it last last year

## 2018-09-18 NOTE — ED Provider Notes (Signed)
[facility] EMERGENCY DEPARTMENT Provider Note   CSN: [id number] Arrival date & time: 09/18/18  1207    History   Chief Complaint Chief Complaint  Patient presents with  . Vaginal Bleeding    HPI Margaret Mora is a 28 y.o. female with a PMH of anemia and headaches presenting with lightheadedness, nausea, and vaginal bleeding onset 30 minutes ago. Patient reports symptoms began as she was walking down the stairs for breakfast. Patient reports she had a tablespoon amount of vaginal bleeding spontaneously. Patient states LMP was last week. Patient states vaginal bleeding has resolved on its own. Patient reports intermittent pelvic cramps. Patient denies taking anything for her symptoms. Patient states nothing makes symptoms better or worse. Patient denies a history of STIs. Patient states she is sexually active with one person and uses protection. Patient reports she had an IUD placed last year. Patient denies fever, chest pain, vomiting, or diarrhea. Patient denies dysuria, frequency, or hematuria. Patient reports mild congestion and intermittent shortness of breath onset this morning, but denies cough, sore throat, or headaches. Patient reports generalized weakness, but denies numbness, syncope, or speech difficulty. Patient denies fall, hitting head, or LOC. Patient reports Jollie thin vaginal discharge. Patient denies sick exposures or recent travel.      HPI  History reviewed. No pertinent past medical history.  There are no active problems to display for this patient.   History reviewed. No pertinent surgical history.   OB History    Gravida  3   Para  1   Term  1   Preterm      AB  1   Living        SAB  1   TAB      Ectopic      Multiple      Live Births               Home Medications    Prior to Admission medications   Medication Sig Start Date End Date Taking? Authorizing Provider  HYDROcodone-acetaminophen (NORCO/VICODIN) 5-325  MG tablet Take 1-2 tablets by mouth every 6 (six) hours as needed for moderate pain. 06/21/16   Vanetta Mulders, MD  Prenatal Vit-Fe Fumarate-FA (PRENATAL MULTIVITAMIN) TABS tablet Take 1 tablet by mouth every evening.    [provider]    Family History No family history on file.  Social History Social History   Tobacco Use  . Smoking status: Never Smoker  . Smokeless tobacco: Never Used  Substance Use Topics  . Alcohol use: Yes    Comment: occassionally  . Drug use: No     Allergies   Patient has no known allergies.   Review of Systems Review of Systems  Constitutional: Negative for chills, diaphoresis and fever.  HENT: Positive for congestion. Negative for rhinorrhea.   Respiratory: Negative for cough and shortness of breath.   Cardiovascular: Negative for chest pain, palpitations and leg swelling.  Gastrointestinal: Positive for abdominal pain and nausea. Negative for constipation, diarrhea and vomiting.  Endocrine: Negative for cold intolerance and heat intolerance.  Genitourinary: Positive for pelvic pain, vaginal bleeding and vaginal discharge. Negative for dysuria, frequency and genital sores.  Musculoskeletal: Negative for back pain.  Skin: Negative for rash.  Allergic/Immunologic: Negative for immunocompromised state.  Neurological: Positive for weakness and light-headedness. Negative for numbness and headaches.  Hematological: Negative for adenopathy.     Physical Exam Updated Vital Signs BP 134/89 (BP Location: Right Arm)   Pulse 83  Temp 98.2 F (36.8 C) (Oral)   Resp 16   LMP 09/12/2018 (Approximate)   SpO2 100%   Physical Exam Vitals signs and nursing note reviewed. Exam conducted with a chaperone present.  Constitutional:      General: She is not in acute distress.    Appearance: She is well-developed. She is not diaphoretic.  HENT:     Head: Normocephalic and atraumatic.     Right Ear: Tympanic membrane, ear canal and external ear  normal.     Left Ear: Tympanic membrane, ear canal and external ear normal.     Nose: No mucosal edema, congestion or rhinorrhea.     Mouth/Throat:     Mouth: Mucous membranes are moist.     Pharynx: No posterior oropharyngeal erythema.  Eyes:     Extraocular Movements: Extraocular movements intact.     Conjunctiva/sclera: Conjunctivae normal.     Pupils: Pupils are equal, round, and reactive to light.  Neck:     Musculoskeletal: Normal range of motion and neck supple.  Cardiovascular:     Rate and Rhythm: Normal rate and regular rhythm.     Heart sounds: Normal heart sounds. No murmur. No friction rub. No gallop.   Pulmonary:     Effort: Pulmonary effort is normal. No respiratory distress.     Breath sounds: Normal breath sounds. No wheezing or rales.  Abdominal:     General: There is no distension.     Palpations: Abdomen is soft.     Tenderness: There is abdominal tenderness (Mild RLQ tenderness on exam. ). There is no right CVA tenderness, left CVA tenderness, guarding or rebound.  Genitourinary:    General: Normal vulva.     Vagina: Normal. No signs of injury. No erythema, tenderness or bleeding.     Cervix: Discharge (Minimal Grasmick discharge noted on exam.) and cervical bleeding (Minimal bleeding noted on exam.) present. No cervical motion tenderness or erythema.     Uterus: Normal.      Adnexa: Right adnexa normal and left adnexa normal.  Musculoskeletal: Normal range of motion.  Skin:    General: Skin is warm.     Findings: No erythema or rash.  Neurological:     Mental Status: She is alert and oriented to person, place, and time.    Mental Status:  Alert, oriented, thought content appropriate, able to give a coherent history. Speech fluent without evidence of aphasia. Able to follow 2 step commands without difficulty.  Cranial Nerves:  II:  Peripheral visual fields grossly normal, pupils equal, round, reactive to light III,IV, VI: ptosis not present, extra-ocular  motions intact bilaterally  V,VII: smile symmetric, facial light touch sensation equal VIII: hearing grossly normal to voice  X: uvula elevates symmetrically  XI: bilateral shoulder shrug symmetric and strong XII: midline tongue extension without fassiculations Motor:  Normal tone. 5/5 in upper and lower extremities bilaterally including strong and equal grip strength and dorsiflexion/plantar flexion Sensory: light touch normal in all extremities.  Deep Tendon Reflexes: 2+ and symmetric in the biceps and patella Cerebellar: normal finger-to-nose with bilateral upper extremities Gait: normal gait and balance.  CV: distal pulses palpable throughout   ED Treatments / Results  Labs (all labs ordered are listed, but only abnormal results are displayed) Labs Reviewed  WET PREP, GENITAL - Abnormal; Notable for the following components:      Result Value   Yeast Wet Prep HPF POC PRESENT (*)    WBC, Wet Prep HPF POC MANY (*)  All other components within normal limits  URINALYSIS, ROUTINE W REFLEX MICROSCOPIC - Abnormal; Notable for the following components:   APPearance HAZY (*)    Hgb urine dipstick MODERATE (*)    Bacteria, UA RARE (*)    All other components within normal limits  CBC WITH DIFFERENTIAL/PLATELET - Abnormal; Notable for the following components:   Hemoglobin 11.7 (*)    All other components within normal limits  BASIC METABOLIC PANEL  POC URINE PREG, ED  GC/CHLAMYDIA PROBE AMP ([place]) NOT AT [facility]    EKG None ED ECG REPORT   Date: 09/18/2018   Rate: 81  Rhythm: normal sinus rhythm  QRS Axis: normal  Intervals: normal  ST/T Wave abnormalities: normal  Conduction Disutrbances:none  Narrative Interpretation: Normal ECG  Old EKG Reviewed: none available  Radiology Dg Chest 2 View  Result Date: 09/18/2018 CLINICAL DATA:  Lightheaded and shortness of breath this morning. EXAM: CHEST - 2 VIEW COMPARISON:  None. FINDINGS: The heart size and mediastinal  contours are within normal limits. Both lungs are clear. The visualized skeletal structures are unremarkable. Mild pectus deformity is noted. IMPRESSION: No acute cardiopulmonary findings. Electronically Signed   By: Rudie Meyer M.D.   On: 09/18/2018 13:41    Procedures Procedures (including critical care time)  Medications Ordered in ED Medications  fluconazole (DIFLUCAN) tablet 150 mg (has no administration in time range)  ondansetron (ZOFRAN-ODT) disintegrating tablet 4 mg (4 mg Oral Given 09/18/18 1259)  ibuprofen (ADVIL,MOTRIN) tablet 600 mg (600 mg Oral Given 09/18/18 1259)  cefTRIAXone (ROCEPHIN) injection 250 mg (250 mg Intramuscular Given 09/18/18 1417)  azithromycin (ZITHROMAX) tablet 1,000 mg (1,000 mg Oral Given 09/18/18 1416)  lidocaine (PF) (XYLOCAINE) 1 % injection (2 mLs  Given 09/18/18 1420)     Initial Impression / Assessment and Plan / ED Course  I have reviewed the triage vital signs and the nursing notes.  Pertinent labs & imaging results that were available during my care of the patient were reviewed by me and considered in my medical decision making (see chart for details).  Clinical Course as of Sep 18 1427  Sun Sep 18, 2018  1254 Yeast present on wet prep.  Yeast Wet Prep HPF POC(!): PRESENT [AH]  1341 Hemoglobin is low at 11.7. This appears slightly lower than previous value at 12.3 about 3 months ago. Will advise patient to follow up with PCP to monitor anemia.  Hemoglobin(!): 11.7 [AH]  1346 No acute cardiopulmonary findings noted on CXR.  DG Chest 2 View [AH]  1355 Patient reports symptoms have resolved while in the ER. Patient denies any abdominal pain or dizziness at this time.   [AH]    Clinical Course User Index [AH] Leretha Dykes, PA-C      Patient presents with vaginal bleeding, lightheadedness, and abdominal pain. Neurological exam is normal. Patient without arrhythmia or tachycardia while here in the department.  Patient without history of  congestive heart failure, hemoglobin is slightly lower than baseline, normal ECG, and systolic blood pressure greater than 90. Patient is nontoxic, nonseptic appearing, in no apparent distress.  Patient's pain and other symptoms adequately managed in emergency department. Oral fluids provided. Labs and vitals reviewed.  Patient does not meet the SIRS or Sepsis criteria.  On repeat exam patient does not have a surgical abdomin and there are no peritoneal signs.  No indication of appendicitis, bowel obstruction, bowel perforation, cholecystitis, diverticulitis, PID or ectopic pregnancy.  Patient denies any more vaginal bleeding, dizziness, or  abdominal pain. Patient denies any more vaginal bleeding since the incident earlier today. Suspect vaginal bleeding and brief pelvic cramps may have been due to menstruation. Pt has remained hemodynamically stable throughout their time in the ED. Patient's symptoms have completely resolved while in the ER.  Pt presents with concerns for possible STD.  Pt understands that they have GC/Chlamydia cultures pending and that they will need to inform all sexual partners if results return positive. Pt has been treated prophylactically with azithromycin and Rocephin due to pts history, pelvic exam, and wet prep with increased WBCs. Pt not concerning for PID because hemodynamically stable and no cervical motion tenderness on pelvic exam. Pt has also been treated with Fluconazole due to yeast infection.Patient to be discharged with instructions to follow up with OBGYN/PCP. Discussed importance of using protection when sexually active.    Patient discharged home and given strict instructions for follow-up with their primary care physician. Advised patient to drink fluids and eat regular meals.  I have also discussed reasons to return immediately to the ER.  Patient expresses understanding and agrees with plan.  Final Clinical Impressions(s) / ED Diagnoses   Final diagnoses:  Vaginal  bleeding  Lightheadedness  Right lower quadrant abdominal pain    ED Discharge Orders    None       Leretha Dykes, PA-C 09/18/18 1440    Arby Barrette, MD 09/18/18 [phone]

## 2018-09-19 LAB — GC/CHLAMYDIA PROBE AMP (~~LOC~~) NOT AT ARMC
Chlamydia: NEGATIVE
Neisseria Gonorrhea: NEGATIVE

## 2019-06-17 ENCOUNTER — Other Ambulatory Visit: Payer: Self-pay

## 2019-06-17 ENCOUNTER — Encounter (HOSPITAL_COMMUNITY): Payer: Self-pay | Admitting: Emergency Medicine

## 2019-06-17 ENCOUNTER — Emergency Department (HOSPITAL_COMMUNITY)
Admission: EM | Admit: 2019-06-17 | Discharge: 2019-06-17 | Disposition: A | Discharge status: Home / Self Care | Payer: Self-pay | Attending: Emergency Medicine | Admitting: Emergency Medicine

## 2019-06-17 DIAGNOSIS — R1084 Generalized abdominal pain: Secondary | ICD-10-CM | POA: Insufficient documentation

## 2019-06-17 DIAGNOSIS — M7918 Myalgia, other site: Secondary | ICD-10-CM | POA: Insufficient documentation

## 2019-06-17 DIAGNOSIS — R6889 Other general symptoms and signs: Secondary | ICD-10-CM

## 2019-06-17 DIAGNOSIS — R531 Weakness: Secondary | ICD-10-CM | POA: Insufficient documentation

## 2019-06-17 DIAGNOSIS — Z20828 Contact with and (suspected) exposure to other viral communicable diseases: Secondary | ICD-10-CM | POA: Insufficient documentation

## 2019-06-17 DIAGNOSIS — Z79899 Other long term (current) drug therapy: Secondary | ICD-10-CM | POA: Insufficient documentation

## 2019-06-17 DIAGNOSIS — R112 Nausea with vomiting, unspecified: Secondary | ICD-10-CM | POA: Insufficient documentation

## 2019-06-17 LAB — GROUP A STREP BY PCR: Group A Strep by PCR: NOT DETECTED

## 2019-06-17 LAB — CBC WITH DIFFERENTIAL/PLATELET
Abs Immature Granulocytes: 0 10*3/uL (ref 0.00–0.07)
Basophils Absolute: 0 10*3/uL (ref 0.0–0.1)
Basophils Relative: 0 %
Eosinophils Absolute: 0.1 10*3/uL (ref 0.0–0.5)
Eosinophils Relative: 2 %
HCT: 38.4 % (ref 36.0–46.0)
Hemoglobin: 12.7 g/dL (ref 12.0–15.0)
Immature Granulocytes: 0 %
Lymphocytes Relative: 56 %
Lymphs Abs: 2.6 10*3/uL (ref 0.7–4.0)
MCH: 30.2 pg (ref 26.0–34.0)
MCHC: 33.1 g/dL (ref 30.0–36.0)
MCV: 91.4 fL (ref 80.0–100.0)
Monocytes Absolute: 0.5 10*3/uL (ref 0.1–1.0)
Monocytes Relative: 10 %
Neutro Abs: 1.5 10*3/uL — ABNORMAL LOW (ref 1.7–7.7)
Neutrophils Relative %: 32 %
Platelets: 253 10*3/uL (ref 150–400)
RBC: 4.2 MIL/uL (ref 3.87–5.11)
RDW: 13.2 % (ref 11.5–15.5)
WBC: 4.7 10*3/uL (ref 4.0–10.5)
nRBC: 0 % (ref 0.0–0.2)

## 2019-06-17 LAB — COMPREHENSIVE METABOLIC PANEL
ALT: 14 U/L (ref 0–44)
AST: 17 U/L (ref 15–41)
Albumin: 4.2 g/dL (ref 3.5–5.0)
Alkaline Phosphatase: 47 U/L (ref 38–126)
Anion gap: 6 (ref 5–15)
BUN: 14 mg/dL (ref 6–20)
CO2: 25 mmol/L (ref 22–32)
Calcium: 9.4 mg/dL (ref 8.9–10.3)
Chloride: 105 mmol/L (ref 98–111)
Creatinine, Ser: 0.66 mg/dL (ref 0.44–1.00)
GFR calc Af Amer: >60 mL/min (ref >60)
GFR calc non Af Amer: >60 mL/min (ref >60)
Glucose, Bld: 104 mg/dL — ABNORMAL HIGH (ref 70–99)
Potassium: 4.1 mmol/L (ref 3.5–5.1)
Sodium: 136 mmol/L (ref 135–145)
Total Bilirubin: 1.8 mg/dL — ABNORMAL HIGH (ref 0.3–1.2)
Total Protein: 7.7 g/dL (ref 6.5–8.1)

## 2019-06-17 LAB — URINALYSIS, ROUTINE W REFLEX MICROSCOPIC
Bacteria, UA: NONE SEEN
Bilirubin Urine: NEGATIVE
Glucose, UA: NEGATIVE mg/dL
Ketones, ur: NEGATIVE mg/dL
Leukocytes,Ua: NEGATIVE
Nitrite: NEGATIVE
Protein, ur: NEGATIVE mg/dL
Specific Gravity, Urine: 1.031 — ABNORMAL HIGH (ref 1.005–1.030)
pH: 6 (ref 5.0–8.0)

## 2019-06-17 LAB — LIPASE, BLOOD: Lipase: 34 U/L (ref 11–51)

## 2019-06-17 LAB — SARS CORONAVIRUS 2 (TAT 6-24 HRS): SARS Coronavirus 2: NEGATIVE

## 2019-06-17 LAB — PREGNANCY, URINE: Preg Test, Ur: NEGATIVE

## 2019-06-17 MED ORDER — NAPROXEN 500 MG PO TABS: 500.0000 mg | ORAL_TABLET | Freq: Two times a day (BID) | ORAL | 0 refills | Status: DC | PRN

## 2019-06-17 NOTE — Discharge Instructions (Signed)
Please take ibuprofen or Tylenol for relief of your body aches and chills.  Encouraged over-the-counter medications for symptomatic relief of your sore throat symptoms.  I recommend cough drops, warm tea with honey, and ibuprofen.   Please return to the ED or seek medical attention should he develop any new or worsening symptoms.  Please read the attachment on COVID-19 and necessary isolation precautions and hand hygiene.

## 2019-06-17 NOTE — ED Notes (Signed)
Pt aware a urine sample is needed, will provide one when able.  

## 2019-06-17 NOTE — ED Notes (Signed)
Discharge instructions and prescription discussed with Pt. Pt verbalized understanding. Pt stable and ambulatory.    

## 2019-06-17 NOTE — ED Provider Notes (Addendum)
[facility] Provider Note   CSN: [id number] Arrival date & time: 06/17/19  1403     History Chief Complaint  Patient presents with  . ? COVID  . Sore Throat  . Generalized Body Aches    Margaret Mora is a 28 y.o. female with no significant past medical history who presents to the ED with a 1 week history of generalized abdominal pain, nausea and vomiting, loose stools, weakness, diminished appetite, generalized body aches, chills, sore throat, and "feeling like my body is shutting down".  Patient denies any obvious sick contacts, but works for [employer].  Patient is on Mirena and denies chance of pregnancy.  She does not use any other regular medications.  No obvious triggers or patterns for abdominal discomfort.  She denies any fevers, cough, headache, dizziness, syncopal episodes, dysuria, chest pain, loss of smell or taste, hematemesis, hematochezia, or melena.  HPI     History reviewed. No pertinent past medical history.  There are no problems to display for this patient.   History reviewed. No pertinent surgical history.   OB History    Gravida  3   Para  1   Term  1   Preterm      AB  1   Living        SAB  1   TAB      Ectopic      Multiple      Live Births              No family history on file.  Social History   Tobacco Use  . Smoking status: Never Smoker  . Smokeless tobacco: Never Used  Substance Use Topics  . Alcohol use: Yes    Comment: occassionally  . Drug use: No    Home Medications Prior to Admission medications   Medication Sig Start Date End Date Taking? Authorizing Provider  HYDROcodone-acetaminophen (NORCO/VICODIN) 5-325 MG tablet Take 1-2 tablets by mouth every 6 (six) hours as needed for moderate pain. 06/21/16   Vanetta MuldersZackowski, Scott, MD  naproxen (NAPROSYN) 500 MG tablet Take 1 tablet (500 mg total) by mouth 2 (two) times daily between meals as needed for moderate pain (As well as  fevers and chills). 06/17/19   Lorelee NewGreen, Aarion Metzgar L, PA-C  Prenatal Vit-Fe Fumarate-FA (PRENATAL MULTIVITAMIN) TABS tablet Take 1 tablet by mouth every evening.    [provider]    Allergies    Patient has no known allergies.  Review of Systems   Review of Systems  All other systems reviewed and are negative.   Physical Exam Updated Vital Signs BP 117/73 (BP Location: Right Arm)   Pulse 91   Temp 98.3 F (36.8 C) (Oral)   Resp 17   LMP 06/15/2019   SpO2 100%   Physical Exam Vitals and nursing note reviewed. Exam conducted with a chaperone present.  Constitutional:      Appearance: Normal appearance.  HENT:     Head: Normocephalic and atraumatic.     Mouth/Throat:     Comments: No tonsillar hypertrophy.  No exudates.  No uvular deviation or masses appreciated.  Mildly erythematous, no significant drainage.  Soft palate nonswollen, nonindurated.  Eyes:     General: No scleral icterus.    Conjunctiva/sclera: Conjunctivae normal.  Cardiovascular:     Rate and Rhythm: Normal rate and regular rhythm.     Pulses: Normal pulses.     Heart sounds: Normal heart sounds.  Pulmonary:  Effort: Pulmonary effort is normal. No respiratory distress.     Breath sounds: Normal breath sounds.  Abdominal:     Comments: Soft, nondistended. Generalized mild TTP, most notably in suprapubic and epigastric region.  No guarding.  No overlying skin changes.  Skin:    General: Skin is dry.  Neurological:     Mental Status: She is alert.     GCS: GCS eye subscore is 4. GCS verbal subscore is 5. GCS motor subscore is 6.  Psychiatric:        Mood and Affect: Mood normal.        Behavior: Behavior normal.        Thought Content: Thought content normal.     ED Results / Procedures / Treatments   Labs (all labs ordered are listed, but only abnormal results are displayed) Labs Reviewed  CBC WITH DIFFERENTIAL/PLATELET - Abnormal; Notable for the following components:      Result Value     Neutro Abs 1.5 (*)    All other components within normal limits  COMPREHENSIVE METABOLIC PANEL - Abnormal; Notable for the following components:   Glucose, Bld 104 (*)    Total Bilirubin 1.8 (*)    All other components within normal limits  URINALYSIS, ROUTINE W REFLEX MICROSCOPIC - Abnormal; Notable for the following components:   Specific Gravity, Urine 1.031 (*)    Hgb urine dipstick LARGE (*)    All other components within normal limits  GROUP A STREP BY PCR  SARS CORONAVIRUS 2 (TAT 6-24 HRS)  LIPASE, BLOOD  PREGNANCY, URINE    EKG None  Radiology No results found.  Procedures Procedures (including critical care time)  Medications Ordered in ED Medications - No data to display  ED Course  I have reviewed the triage vital signs and the nursing notes.  Pertinent labs & imaging results that were available during my care of the patient were reviewed by me and considered in my medical decision making (see chart for details).    MDM Rules/Calculators/A&P       CBC demonstrates no anemia or leukocytosis concerning for infection.  CMP demonstrates no transaminitis, renal impairment, or electrolyte deficiencies.  Lipase was normal and UA demonstrated no evidence of infection and her urine pregnancy was negative.  Patient has not exhibited any evidence of nausea or vomiting while in the ED here today.  She reports that her nausea is intermittent and relatively well controlled with ginger ale.  Patient's history and physical exam is consistent with a viral infection, possibly COVID-19.  Patient will be tested for COVID-19, but not influenza given the chronicity of her illness since it would not change management.  Group A strep by PCR was negative.  Emphasized the importance of oral hydration and over-the-counter medications for symptomatic relief.  Tylenol and ibuprofen for fevers and chills, as needed.  Discussed strict precautions including but not limited to difficulty  breathing, uncontrolled fevers and chills, uncontrolled nausea vomiting, or any other new or worsening symptoms. All of the evaluation and work-up results were discussed with the patient and any family at bedside. They were provided opportunity to ask any additional questions and have none at this time. They have expressed understanding of verbal discharge instructions as well as return precautions and are agreeable to the plan.   Naydeline Morace was evaluated in Emergency Department on 06/17/2019 for the symptoms described in the history of present illness. She was evaluated in the context of the global COVID-19 pandemic, which necessitated  consideration that the patient might be at risk for infection with the SARS-CoV-2 virus that causes COVID-19. Institutional protocols and algorithms that pertain to the evaluation of patients at risk for COVID-19 are in a state of rapid change based on information released by regulatory bodies including the CDC and federal and state organizations. These policies and algorithms were followed during the patient's care in the ED.   Final Clinical Impression(s) / ED Diagnoses Final diagnoses:  Flu-like symptoms    Rx / DC Orders ED Discharge Orders         Ordered    naproxen (NAPROSYN) 500 MG tablet  2 times daily between meals PRN     06/17/19 1802           Corena Herter, PA-C 06/17/19 1803    Corena Herter, PA-C 06/17/19 1813    Lajean Saver, MD 06/18/19 [phone]

## 2019-06-17 NOTE — ED Triage Notes (Signed)
C/o sore throat, chills, nausea, vomiting, diarrhea, and generalized body aches x 1 week.

## 2019-08-18 ENCOUNTER — Emergency Department (HOSPITAL_COMMUNITY): Payer: Managed Care, Other (non HMO)

## 2019-08-18 ENCOUNTER — Other Ambulatory Visit: Payer: Self-pay

## 2019-08-18 ENCOUNTER — Emergency Department (HOSPITAL_COMMUNITY)
Admission: EM | Admit: 2019-08-18 | Discharge: 2019-08-18 | Disposition: A | Discharge status: Home / Self Care | Payer: Managed Care, Other (non HMO) | Attending: Emergency Medicine | Admitting: Emergency Medicine

## 2019-08-18 ENCOUNTER — Encounter (HOSPITAL_COMMUNITY): Payer: Self-pay | Admitting: Emergency Medicine

## 2019-08-18 DIAGNOSIS — R102 Pelvic and perineal pain: Secondary | ICD-10-CM | POA: Diagnosis not present

## 2019-08-18 DIAGNOSIS — R1032 Left lower quadrant pain: Secondary | ICD-10-CM

## 2019-08-18 DIAGNOSIS — Z79899 Other long term (current) drug therapy: Secondary | ICD-10-CM | POA: Diagnosis not present

## 2019-08-18 DIAGNOSIS — N760 Acute vaginitis: Secondary | ICD-10-CM | POA: Diagnosis not present

## 2019-08-18 DIAGNOSIS — B9689 Other specified bacterial agents as the cause of diseases classified elsewhere: Secondary | ICD-10-CM | POA: Diagnosis not present

## 2019-08-18 LAB — COMPREHENSIVE METABOLIC PANEL
ALT: 16 U/L (ref 0–44)
AST: 17 U/L (ref 15–41)
Albumin: 4.3 g/dL (ref 3.5–5.0)
Alkaline Phosphatase: 50 U/L (ref 38–126)
Anion gap: 9 (ref 5–15)
BUN: 5 mg/dL — ABNORMAL LOW (ref 6–20)
CO2: 25 mmol/L (ref 22–32)
Calcium: 9.4 mg/dL (ref 8.9–10.3)
Chloride: 101 mmol/L (ref 98–111)
Creatinine, Ser: 0.6 mg/dL (ref 0.44–1.00)
GFR calc Af Amer: >60 mL/min (ref >60)
GFR calc non Af Amer: >60 mL/min (ref >60)
Glucose, Bld: 85 mg/dL (ref 70–99)
Potassium: 3.9 mmol/L (ref 3.5–5.1)
Sodium: 135 mmol/L (ref 135–145)
Total Bilirubin: 1.6 mg/dL — ABNORMAL HIGH (ref 0.3–1.2)
Total Protein: 8 g/dL (ref 6.5–8.1)

## 2019-08-18 LAB — CBC WITH DIFFERENTIAL/PLATELET
Abs Immature Granulocytes: 0.01 10*3/uL (ref 0.00–0.07)
Basophils Absolute: 0 10*3/uL (ref 0.0–0.1)
Basophils Relative: 0 %
Eosinophils Absolute: 0 10*3/uL (ref 0.0–0.5)
Eosinophils Relative: 1 %
HCT: 40.2 % (ref 36.0–46.0)
Hemoglobin: 13 g/dL (ref 12.0–15.0)
Immature Granulocytes: 0 %
Lymphocytes Relative: 18 %
Lymphs Abs: 1.2 10*3/uL (ref 0.7–4.0)
MCH: 30.2 pg (ref 26.0–34.0)
MCHC: 32.3 g/dL (ref 30.0–36.0)
MCV: 93.3 fL (ref 80.0–100.0)
Monocytes Absolute: 0.6 10*3/uL (ref 0.1–1.0)
Monocytes Relative: 8 %
Neutro Abs: 5.2 10*3/uL (ref 1.7–7.7)
Neutrophils Relative %: 73 %
Platelets: 243 10*3/uL (ref 150–400)
RBC: 4.31 MIL/uL (ref 3.87–5.11)
RDW: 13.2 % (ref 11.5–15.5)
WBC: 7 10*3/uL (ref 4.0–10.5)
nRBC: 0 % (ref 0.0–0.2)

## 2019-08-18 LAB — I-STAT BETA HCG BLOOD, ED (MC, WL, AP ONLY): I-stat hCG, quantitative: <5 m[IU]/mL (ref <5)

## 2019-08-18 LAB — WET PREP, GENITAL
Sperm: NONE SEEN
Trich, Wet Prep: NONE SEEN

## 2019-08-18 LAB — URINALYSIS, ROUTINE W REFLEX MICROSCOPIC
Bilirubin Urine: NEGATIVE
Glucose, UA: NEGATIVE mg/dL
Hgb urine dipstick: NEGATIVE
Ketones, ur: NEGATIVE mg/dL
Leukocytes,Ua: NEGATIVE
Nitrite: NEGATIVE
Protein, ur: NEGATIVE mg/dL
Specific Gravity, Urine: 1.02 (ref 1.005–1.030)
pH: 7 (ref 5.0–8.0)

## 2019-08-18 LAB — LIPASE, BLOOD: Lipase: 28 U/L (ref 11–51)

## 2019-08-18 LAB — HIV ANTIBODY (ROUTINE TESTING W REFLEX): HIV Screen 4th Generation wRfx: NONREACTIVE

## 2019-08-18 MED ORDER — IOHEXOL 300 MG/ML  SOLN
100.0000 mL | Freq: Once | INTRAMUSCULAR | Status: AC | PRN
  Administered 2019-08-18: 100 mL via INTRAVENOUS

## 2019-08-18 MED ORDER — KETOROLAC TROMETHAMINE 30 MG/ML IJ SOLN
15.0000 mg | Freq: Once | INTRAMUSCULAR | Status: AC
  Administered 2019-08-18: 14:00:00 15 mg via INTRAVENOUS
  Filled 2019-08-18: qty 1

## 2019-08-18 MED ORDER — METRONIDAZOLE 500 MG PO TABS: 500.0000 mg | ORAL_TABLET | Freq: Two times a day (BID) | ORAL | 0 refills | Status: AC

## 2019-08-18 MED ORDER — MORPHINE SULFATE (PF) 4 MG/ML IV SOLN
4.0000 mg | Freq: Once | INTRAVENOUS | Status: AC
  Administered 2019-08-18: 4 mg via INTRAVENOUS
  Filled 2019-08-18: qty 1

## 2019-08-18 MED ORDER — MORPHINE SULFATE (PF) 2 MG/ML IV SOLN: 2.0000 mg | Freq: Once | INTRAVENOUS | Status: DC

## 2019-08-18 MED ORDER — IBUPROFEN 800 MG PO TABS: 800.0000 mg | ORAL_TABLET | Freq: Three times a day (TID) | ORAL | 0 refills | Status: DC | PRN

## 2019-08-18 MED ORDER — ONDANSETRON HCL 4 MG/2ML IJ SOLN
4.0000 mg | Freq: Once | INTRAMUSCULAR | Status: AC
  Administered 2019-08-18: 14:00:00 4 mg via INTRAVENOUS
  Filled 2019-08-18: qty 2

## 2019-08-18 MED ORDER — ONDANSETRON HCL 4 MG/2ML IJ SOLN
4.0000 mg | Freq: Once | INTRAMUSCULAR | Status: AC
  Administered 2019-08-18: 09:00:00 4 mg via INTRAVENOUS
  Filled 2019-08-18: qty 2

## 2019-08-18 NOTE — ED Notes (Signed)
Pelvic cart at bedside. 

## 2019-08-18 NOTE — ED Triage Notes (Signed)
Patient c/o left lower abdominal pain radiating into the middle onset of this am. Denies any urinary symptoms or N/V/D. Describes pain as cramping.

## 2019-08-18 NOTE — Discharge Instructions (Signed)
Margaret Mora,   You were evaluated in the ER for lower abdominal pain. The source of your pain is most likely a ruptured ovarian cyst, as there was fluid seen on the imaging studies. A prescription for Ibuprofen was sent to your pharmacy. Make sure to eat food when taking Ibuprofen.   Your IUD was noted to be not quite in the right position. Please use back up birth control until you have been cleared by your OB/GYN.   An antibiotic was sent to your pharmacy for Bacterial Vaginosis as well.   Please follow up with your primary care doctor or OB/GYN in the next week.   If you develop:  - worsening pain - fever or chills - vomiting Please return to the ED

## 2019-08-18 NOTE — ED Provider Notes (Signed)
[facility] EMERGENCY DEPARTMENT Provider Note   CSN: [id number] Arrival date & time: 08/18/19  2633   History Chief Complaint  Patient presents with  . Abdominal Pain    Margaret Mora is a 29 y.o. female who presents to the ED with c/o abdominal pain.   Ms. Wechter states that the patient started suddenly this AM at approximately 6:00 AM. The pain was initially in the LLQ but started to radiate to the periumbilical region after a few minutes. The pain feels pulsating and described as 10/10 when she moves around but 8/10 when staying still. The pain is also alleviated by leaning on her right side. No other alleviating or exacerbating factors. She endorses nausea but denies fever, chills, vomiting. She had two BM yesterday and passed gas this AM. Yesterday, she noted yellow, foul-smelling vaginal discharge that is unusual for her. She denies vaginal pain, dysuria, hematuria. Patient reports she is in a monogamous long-term relationship with her boyfriend and has no known exposures to STD. She has an IUD for birth control. Last menstrual cycles was unusual as it lasted longer than usual, it started 1-2 weeks ago.   Patient denies any recent illnesses, including sore throat, cough, SOB.  Past Medical History:  - Iron deficiency anemia   Past Surgical History:  - Caesarian   OB History    Gravida  3   Para  1   Term  1   Preterm      AB  1   Living        SAB  1   TAB      Ectopic      Multiple      Live Births             No family history on file.  Social History:  - Reports she used a [employer] account pl[employer] - Alcohol use only on the weekends and usually red wine  Home Medications Prior to Admission medications   Medication Sig Start Date End Date Taking? Authorizing Provider  HYDROcodone-acetaminophen (NORCO/VICODIN) 5-325 MG tablet Take 1-2 tablets by mouth every 6 (six) hours as needed for moderate pain. 06/21/16   Vanetta Mulders, MD  naproxen  (NAPROSYN) 500 MG tablet Take 1 tablet (500 mg total) by mouth 2 (two) times daily between meals as needed for moderate pain (As well as fevers and chills). 06/17/19   Lorelee New, PA-C  Prenatal Vit-Fe Fumarate-FA (PRENATAL MULTIVITAMIN) TABS tablet Take 1 tablet by mouth every evening.    [provider]    Allergies    Patient has no known allergies.  Review of Systems   Review of Systems  Constitutional: Negative for chills and fever.  HENT: Negative for congestion, rhinorrhea and sore throat.   Respiratory: Negative for cough and shortness of breath.   Cardiovascular: Negative for chest pain.  Gastrointestinal: Positive for abdominal pain and nausea. Negative for blood in stool, constipation, diarrhea and vomiting.  Genitourinary: Positive for vaginal discharge. Negative for difficulty urinating, dyspareunia, dysuria, hematuria, vaginal bleeding and vaginal pain.  Musculoskeletal: Negative for arthralgias and myalgias.  Skin: Negative for rash and wound.  Neurological: Negative for dizziness and weakness.   Physical Exam Updated Vital Signs BP (!) 127/94   Pulse 93   Temp 98.4 F (36.9 C) (Oral)   Resp 16   Ht 5\' 2"  (1.575 m)   Wt 63.5 kg   LMP 08/11/2019   SpO2 100%   BMI 25.61 kg/m   Physical Exam  Vitals and nursing note reviewed. Exam conducted with a chaperone present.  Constitutional:      General: She is not in acute distress.    Appearance: She is normal weight.  Cardiovascular:     Rate and Rhythm: Normal rate and regular rhythm.     Heart sounds: No murmur.  Pulmonary:     Effort: Pulmonary effort is normal. No respiratory distress.     Breath sounds: Normal breath sounds.  Abdominal:     General: Bowel sounds are decreased. There is no distension. There are no signs of injury.     Palpations: Abdomen is soft. There is no fluid wave.     Tenderness: There is abdominal tenderness in the right lower quadrant, periumbilical area, suprapubic area  and left lower quadrant. Right CVA tenderness: minimal.  Genitourinary:    General: Normal vulva.     Pubic Area: No rash.      Labia:        Right: No rash or lesion.        Left: No rash or lesion.      Vagina: Vaginal discharge (significant yellow discharge present) present. No erythema or bleeding.     Cervix: Normal.     Adnexa:        Right: Tenderness present. No mass.         Left: Tenderness present. No mass.       Comments: Unable to visualize IUD string Skin:    General: Skin is warm and dry.     Capillary Refill: Capillary refill takes less than 2 seconds.  Neurological:     Mental Status: She is alert and oriented to person, place, and time.  Psychiatric:        Mood and Affect: Mood normal.        Behavior: Behavior normal.    ED Results / Procedures / Treatments   Labs (all labs ordered are listed, but only abnormal results are displayed) Labs Reviewed  WET PREP, GENITAL  URINALYSIS, ROUTINE W REFLEX MICROSCOPIC  COMPREHENSIVE METABOLIC PANEL  CBC WITH DIFFERENTIAL/PLATELET  LIPASE, BLOOD  RPR  HIV ANTIBODY (ROUTINE TESTING W REFLEX)  I-STAT BETA HCG BLOOD, ED ([facility], [facility], [facility] ONLY)  GC/CHLAMYDIA PROBE AMP ([place]) NOT AT [facility]   EKG None  Radiology No results found.  Procedures Procedures (including critical care time)  Medications Ordered in ED Medications  ondansetron (ZOFRAN) injection 4 mg (has no administration in time range)  morphine 4 MG/ML injection 4 mg (has no administration in time range)    ED Course  I have reviewed the triage vital signs and the nursing notes.  Pertinent labs & imaging results that were available during my care of the patient were reviewed by me and considered in my medical decision making (see chart for details).   MDM Rules/Calculators/A&P                      Ms. Tackett is a generally healthy 29 y/o female presenting with LLQ abdominal pain that has not radiated into the periumbilical and RLQ region.    Given location of abdominal tenderness and recent history of vaginal discharge and history of unprotected intercourse, differential diagnosis includes PID versus ovarian torsion versus ectopic pregnancy.  There is also concern for acute appendicitis.  Patient endorses recent BM and passing gas, but constipation could still be a cause.  Work-up was initiated with CBC, CMP, UA, lipase.  On pelvic exam there was substantial  yellow discharge but minimal cervical motion tenderness.  G/C probe, wet prep, RPR, and HIV ordered.  For further evaluation to rule out torsion, transvaginal ultrasound was ordered.  Initial findings showed unremarkable CBC and CMP.  Lipase was negative.  Beta hCG was negative.  Wet prep was positive for yeast, clue cells and WBCs.  Although bacterial vaginosis could certainly be contributing to her vaginal discharge, it would not explain her abdominal pain.  For further evaluation, CT abdomen pelvis with contrast was ordered.  Results showed free fluid in the pelvis with ascites.  Given sudden onset of pain this a.m. with location of pain, there is high suspicion for a ruptured ovarian cyst.  Of note, her Mirena was noticed to be not in optimal position.  Advised patient to use secondary birth control until evaluated by OB/GYN.  Findings was discussed with the patient.  Her pain was controlled in the ER with morphine and Toradol.  On discharge she will be prescribed 800 mg of ibuprofen.  For the BV, will send a 7-day course of Flagyl.  Strict return precautions discussed in recommended close follow-up with OB/GYN.  Final Clinical Impression(s) / ED Diagnoses Final diagnoses:  LLQ pain   Rx / DC Orders ED Discharge Orders    None       Verdene Lennert, MD 08/18/19 2118    Tilden Fossa, MD 08/19/19 1056

## 2019-08-19 LAB — GC/CHLAMYDIA PROBE AMP (~~LOC~~) NOT AT ARMC
Chlamydia: NEGATIVE
Neisseria Gonorrhea: NEGATIVE

## 2019-08-19 LAB — RPR: RPR Ser Ql: NONREACTIVE

## 2019-08-23 ENCOUNTER — Other Ambulatory Visit: Payer: Self-pay

## 2019-08-23 ENCOUNTER — Emergency Department (HOSPITAL_COMMUNITY)
Admission: EM | Admit: 2019-08-23 | Discharge: 2019-08-23 | Disposition: A | Discharge status: Home / Self Care | Payer: Managed Care, Other (non HMO) | Attending: Emergency Medicine | Admitting: Emergency Medicine

## 2019-08-23 ENCOUNTER — Encounter (HOSPITAL_COMMUNITY): Payer: Self-pay

## 2019-08-23 DIAGNOSIS — Z79899 Other long term (current) drug therapy: Secondary | ICD-10-CM | POA: Insufficient documentation

## 2019-08-23 DIAGNOSIS — R1030 Lower abdominal pain, unspecified: Secondary | ICD-10-CM | POA: Insufficient documentation

## 2019-08-23 DIAGNOSIS — K625 Hemorrhage of anus and rectum: Secondary | ICD-10-CM | POA: Insufficient documentation

## 2019-08-23 LAB — CBC
HCT: 37.5 % (ref 36.0–46.0)
Hemoglobin: 12.3 g/dL (ref 12.0–15.0)
MCH: 30.4 pg (ref 26.0–34.0)
MCHC: 32.8 g/dL (ref 30.0–36.0)
MCV: 92.8 fL (ref 80.0–100.0)
Platelets: 264 10*3/uL (ref 150–400)
RBC: 4.04 MIL/uL (ref 3.87–5.11)
RDW: 13.2 % (ref 11.5–15.5)
WBC: 5.3 10*3/uL (ref 4.0–10.5)
nRBC: 0 % (ref 0.0–0.2)

## 2019-08-23 LAB — COMPREHENSIVE METABOLIC PANEL
ALT: 14 U/L (ref 0–44)
AST: 23 U/L (ref 15–41)
Albumin: 4 g/dL (ref 3.5–5.0)
Alkaline Phosphatase: 42 U/L (ref 38–126)
Anion gap: 10 (ref 5–15)
BUN: 7 mg/dL (ref 6–20)
CO2: 23 mmol/L (ref 22–32)
Calcium: 9.1 mg/dL (ref 8.9–10.3)
Chloride: 101 mmol/L (ref 98–111)
Creatinine, Ser: 0.63 mg/dL (ref 0.44–1.00)
GFR calc Af Amer: >60 mL/min (ref >60)
GFR calc non Af Amer: >60 mL/min (ref >60)
Glucose, Bld: 100 mg/dL — ABNORMAL HIGH (ref 70–99)
Potassium: 3.7 mmol/L (ref 3.5–5.1)
Sodium: 134 mmol/L — ABNORMAL LOW (ref 135–145)
Total Bilirubin: 1.1 mg/dL (ref 0.3–1.2)
Total Protein: 7.2 g/dL (ref 6.5–8.1)

## 2019-08-23 LAB — TYPE AND SCREEN
ABO/RH(D): A POS
Antibody Screen: NEGATIVE

## 2019-08-23 LAB — I-STAT BETA HCG BLOOD, ED (MC, WL, AP ONLY): I-stat hCG, quantitative: <5 m[IU]/mL (ref <5)

## 2019-08-23 LAB — ABO/RH: ABO/RH(D): A POS

## 2019-08-23 LAB — LIPASE, BLOOD: Lipase: 35 U/L (ref 11–51)

## 2019-08-23 LAB — POC OCCULT BLOOD, ED: Fecal Occult Bld: POSITIVE — AB

## 2019-08-23 MED ORDER — AMOXICILLIN-POT CLAVULANATE 875-125 MG PO TABS: 1.0000 | ORAL_TABLET | Freq: Two times a day (BID) | ORAL | 0 refills | Status: DC

## 2019-08-23 MED ORDER — ONDANSETRON 4 MG PO TBDP
4.0000 mg | ORAL_TABLET | Freq: Once | ORAL | Status: AC
  Administered 2019-08-23: 4 mg via ORAL
  Filled 2019-08-23: qty 1

## 2019-08-23 NOTE — Discharge Instructions (Signed)
Take the antibiotics as directed. Follow-up with the GI doctor listed below for further management and evaluation of your bleeding. Return to the ED if you start to have worsening bleeding, lightheadedness, shortness of breath, worsening abdominal pain or fever.

## 2019-08-23 NOTE — ED Notes (Signed)
Pt given Ginger Ale and Saltine crackers, per Hina - PA.

## 2019-08-23 NOTE — ED Provider Notes (Signed)
[place] EMERGENCY DEPARTMENT Provider Note   CSN: [id number] Arrival date & time: 08/23/19  1357     History Chief Complaint  Patient presents with  . Rectal Bleeding  . Abdominal Pain    Margaret Mora is a 29 y.o. female who presents to ED with a chief complaint of rectal bleeding.  She was seen and evaluated on 08/18/2019 for lower abdominal pain, diagnosed with ovarian cysts and discharged home with naproxen.  She has been taking this medication with only minimal improvement in her pain.  States that today she was at work, began to urinate and then felt like she passed gas and had one episode of bright red blood per rectum.  This was painless.  She does not believe that the bleeding was vaginal in nature.  She did have a normal bowel movement this morning without any bleeding.  She had a second episode of bright red blood per rectum while in the ER as well.  She denies history of similar symptoms in the past.  She denies anticoagulant use.  States that her abdominal pain has not changed or worsened since she presented to the ER last week.  Denies any prior colonoscopy, vomiting, diarrhea, nausea, fever, vaginal discharge.  HPI     History reviewed. No pertinent past medical history.  There are no problems to display for this patient.   History reviewed. No pertinent surgical history.   OB History    Gravida  3   Para  1   Term  1   Preterm      AB  1   Living        SAB  1   TAB      Ectopic      Multiple      Live Births              History reviewed. No pertinent family history.  Social History   Tobacco Use  . Smoking status: Never Smoker  . Smokeless tobacco: Never Used  Substance Use Topics  . Alcohol use: Yes    Comment: occassionally  . Drug use: No    Home Medications Prior to Admission medications   Medication Sig Start Date End Date Taking? Authorizing Provider  amoxicillin-clavulanate (AUGMENTIN) 875-125 MG  tablet Take 1 tablet by mouth every 12 (twelve) hours. 08/23/19   Abbigaile Rockman, PA-C  ferrous sulfate 325 (65 FE) MG tablet Take 325 mg by mouth 2 (two) times daily with a meal.    [provider]  HYDROcodone-acetaminophen (NORCO/VICODIN) 5-325 MG tablet Take 1-2 tablets by mouth every 6 (six) hours as needed for moderate pain. Patient not taking: Reported on 08/18/2019 06/21/16   Fredia Sorrow, MD  ibuprofen (ADVIL) 800 MG tablet Take 1 tablet (800 mg total) by mouth every 8 (eight) hours as needed. 08/18/19   Jose Persia, MD  metroNIDAZOLE (FLAGYL) 500 MG tablet Take 1 tablet (500 mg total) by mouth 2 (two) times daily for 7 days. 08/18/19 08/25/19  Jose Persia, MD  Multiple Vitamins-Minerals (MULTIVITAMIN WITH MINERALS) tablet Take 1 tablet by mouth daily.    [provider]  naproxen (NAPROSYN) 500 MG tablet Take 1 tablet (500 mg total) by mouth 2 (two) times daily between meals as needed for moderate pain (As well as fevers and chills). Patient not taking: Reported on 08/18/2019 06/17/19   Corena Herter, PA-C    Allergies    Patient has no known allergies.  Review of Systems  Review of Systems  Constitutional: Negative for appetite change, chills and fever.  HENT: Negative for ear pain, rhinorrhea, sneezing and sore throat.   Eyes: Negative for photophobia and visual disturbance.  Respiratory: Negative for cough, chest tightness, shortness of breath and wheezing.   Cardiovascular: Negative for chest pain and palpitations.  Gastrointestinal: Positive for abdominal pain and blood in stool. Negative for constipation, diarrhea, nausea and vomiting.  Genitourinary: Negative for dysuria, hematuria and urgency.  Musculoskeletal: Negative for myalgias.  Skin: Negative for rash.  Neurological: Negative for dizziness, weakness and light-headedness.    Physical Exam Updated Vital Signs BP 137/77 (BP Location: Right Arm)   Pulse 91   Temp 98.9 F (37.2 C) (Oral)    Resp 16   Ht 5\' 2"  (1.575 m)   Wt 64 kg   LMP 08/11/2019   SpO2 98%   BMI 25.81 kg/m   Physical Exam Vitals and nursing note reviewed. Exam conducted with a chaperone present.  Constitutional:      General: She is not in acute distress.    Appearance: She is well-developed.  HENT:     Head: Normocephalic and atraumatic.     Nose: Nose normal.  Eyes:     General: No scleral icterus.       Left eye: No discharge.     Conjunctiva/sclera: Conjunctivae normal.  Cardiovascular:     Rate and Rhythm: Normal rate and regular rhythm.     Heart sounds: Normal heart sounds. No murmur. No friction rub. No gallop.   Pulmonary:     Effort: Pulmonary effort is normal. No respiratory distress.     Breath sounds: Normal breath sounds.  Abdominal:     General: Bowel sounds are normal. There is no distension.     Palpations: Abdomen is soft.     Tenderness: There is no abdominal tenderness. There is no guarding.  Genitourinary:    Rectum: Guaiac result positive. No mass, tenderness, anal fissure, external hemorrhoid or internal hemorrhoid.  Musculoskeletal:        General: Normal range of motion.     Cervical back: Normal range of motion and neck supple.  Skin:    General: Skin is warm and dry.     Findings: No rash.  Neurological:     Mental Status: She is alert.     Motor: No abnormal muscle tone.     Coordination: Coordination normal.     ED Results / Procedures / Treatments   Labs (all labs ordered are listed, but only abnormal results are displayed) Labs Reviewed  COMPREHENSIVE METABOLIC PANEL - Abnormal; Notable for the following components:      Result Value   Sodium 134 (*)    Glucose, Bld 100 (*)    All other components within normal limits  POC OCCULT BLOOD, ED - Abnormal; Notable for the following components:   Fecal Occult Bld POSITIVE (*)    All other components within normal limits  CBC  LIPASE, BLOOD  I-STAT BETA HCG BLOOD, ED ([facility], [facility], [facility] ONLY)  TYPE AND  SCREEN  ABO/RH    EKG None  Radiology No results found.  Procedures Procedures (including critical care time)  Medications Ordered in ED Medications  ondansetron (ZOFRAN-ODT) disintegrating tablet 4 mg (4 mg Oral Given 08/23/19 1710)    ED Course  I have reviewed the triage vital signs and the nursing notes.  Pertinent labs & imaging results that were available during my care of the patient were reviewed by  me and considered in my medical decision making (see chart for details).    MDM Rules/Calculators/A&P                      29 year old female presenting to the ED with a chief complaint of rectal bleeding.  She was seen and evaluated on 08/18/2019 for abdominal pain, diagnosed with ovarian cyst and discharged home with naproxen.  She continues to have some lateral lower abdominal pain that is essentially unchanged from last week.  Reports 2 episodes of bright red blood per rectum today.  Denies any changes to bowel movements, sick contacts with similar symptoms, anticoagulant use, lightheadedness, shortness of breath.  She has no abdominal tenderness on my exam.  Her vital signs are within normal limits, she is hemodynamically stable.  Lab work here including hCG negative.  CBC with normal hemoglobin and hematocrit.  Lipase, CMP unremarkable.  Stool is Hemoccult positive.  I reviewed the CT scans from her visit last week which showed no evidence of diverticular disease or other abnormalities.  It is unlikely that her symptoms are due to diverticulitis although I believe the symptoms could be due to colitis.  She is otherwise healthy, low risk for other emergent cause of her rectal bleeding and she.  Hemodynamically stable.  Will treat with antibiotics and have her follow-up with GI for further evaluation and management.  Patient is agreeable to the plan.  Patient is hemodynamically stable, in NAD, and able to ambulate in the ED. Evaluation does not show pathology that would require  ongoing emergent intervention or inpatient treatment. I have personally reviewed and interpreted all lab work and imaging at today's ED visit. I explained the diagnosis to the patient. Pain has been managed and has no complaints prior to discharge. Patient is comfortable with above plan and is stable for discharge at this time. All questions were answered prior to disposition. Strict return precautions for returning to the ED were discussed. Encouraged follow up with PCP.   An After Visit Summary was printed and given to the patient.   Portions of this note were generated with Scientist, clinical (histocompatibility and immunogenetics). Dictation errors may occur despite best attempts at proofreading.  Final Clinical Impression(s) / ED Diagnoses Final diagnoses:  Rectal bleeding    Rx / DC Orders ED Discharge Orders         Ordered    amoxicillin-clavulanate (AUGMENTIN) 875-125 MG tablet  Every 12 hours     08/23/19 1756           Dietrich Pates, PA-C 08/23/19 1837    Derwood Kaplan, MD 08/25/19 [phone]

## 2019-08-23 NOTE — ED Triage Notes (Signed)
Pt arrives POV for eval of ongoing abd discomfort, weakness and fatigue since being seen here for ovarian cyst on 2/12. Pt reports today while at work she passed gas while on the toilet and notes that she "passed a large amount of blood". Pt reports shortness of breath with exertion as well

## 2019-08-23 NOTE — ED Notes (Signed)
Patient verbalizes understanding of discharge instructions. Opportunity for questioning and answers were provided. Armband removed by staff, pt discharged from ED ambulatory.   

## 2019-09-01 ENCOUNTER — Ambulatory Visit (INDEPENDENT_AMBULATORY_CARE_PROVIDER_SITE_OTHER): Payer: Managed Care, Other (non HMO) | Admitting: Obstetrics and Gynecology

## 2019-09-01 ENCOUNTER — Other Ambulatory Visit: Payer: Self-pay

## 2019-09-01 ENCOUNTER — Encounter: Payer: Self-pay | Admitting: Obstetrics and Gynecology

## 2019-09-01 VITALS — BP 110/62 | HR 68 | Temp 97.5°F | Resp 16 | Ht 62.25 in | Wt 140.0 lb

## 2019-09-01 DIAGNOSIS — B9689 Other specified bacterial agents as the cause of diseases classified elsewhere: Secondary | ICD-10-CM | POA: Diagnosis not present

## 2019-09-01 DIAGNOSIS — Z3009 Encounter for other general counseling and advice on contraception: Secondary | ICD-10-CM

## 2019-09-01 DIAGNOSIS — Z01419 Encounter for gynecological examination (general) (routine) without abnormal findings: Secondary | ICD-10-CM | POA: Diagnosis not present

## 2019-09-01 DIAGNOSIS — Z30017 Encounter for initial prescription of implantable subdermal contraceptive: Secondary | ICD-10-CM

## 2019-09-01 DIAGNOSIS — T8332XA Displacement of intrauterine contraceptive device, initial encounter: Secondary | ICD-10-CM

## 2019-09-01 DIAGNOSIS — N760 Acute vaginitis: Secondary | ICD-10-CM | POA: Diagnosis not present

## 2019-09-01 DIAGNOSIS — Z30432 Encounter for removal of intrauterine contraceptive device: Secondary | ICD-10-CM

## 2019-09-01 DIAGNOSIS — Z23 Encounter for immunization: Secondary | ICD-10-CM | POA: Diagnosis not present

## 2019-09-01 MED ORDER — METRONIDAZOLE 0.75 % VA GEL: 1.0000 | Freq: Every day | VAGINAL | 0 refills | Status: DC

## 2019-09-01 NOTE — Addendum Note (Signed)
Addended by: Isabell Jarvis on: 09/01/2019 05:12 PM   Modules accepted: Orders

## 2019-09-01 NOTE — Patient Instructions (Addendum)
EXERCISE AND DIET:  We recommended that you start or continue a regular exercise program for good health. Regular exercise means any activity that makes your heart beat faster and makes you sweat.  We recommend exercising at least 30 minutes per day at least 3 days a week, preferably 4 or 5.  We also recommend a diet low in fat and sugar.  Inactivity, poor dietary choices and obesity can cause diabetes, heart attack, stroke, and kidney damage, among others.    ALCOHOL AND SMOKING:  Women should limit their alcohol intake to no more than 7 drinks/beers/glasses of wine (combined, not each!) per week. Moderation of alcohol intake to this level decreases your risk of breast cancer and liver damage. And of course, no recreational drugs are part of a healthy lifestyle.  And absolutely no smoking or even second hand smoke. Most people know smoking can cause heart and lung diseases, but did you know it also contributes to weakening of your bones? Aging of your skin?  Yellowing of your teeth and nails?  CALCIUM AND VITAMIN D:  Adequate intake of calcium and Vitamin D are recommended.  The recommendations for exact amounts of these supplements seem to change often, but generally speaking 1,000 mg of calcium (between diet and supplement) and 800 units of Vitamin D per day seems prudent. Certain women may benefit from higher intake of Vitamin D.  If you are among these women, your doctor will have told you during your visit.    PAP SMEARS:  Pap smears, to check for cervical cancer or precancers,  have traditionally been done yearly, although recent scientific advances have shown that most women can have pap smears less often.  However, every woman still should have a physical exam from her gynecologist every year. It will include a breast check, inspection of the vulva and vagina to check for abnormal growths or skin changes, a visual exam of the cervix, and then an exam to evaluate the size and shape of the uterus and  ovaries.  And after 27 years of age, a rectal exam is indicated to check for rectal cancers. We will also provide age appropriate advice regarding health maintenance, like when you should have certain vaccines, screening for sexually transmitted diseases, bone density testing, colonoscopy, mammograms, etc.   MAMMOGRAMS:  All women over 40 years old should have a yearly mammogram. Many facilities now offer a "3D" mammogram, which may cost around $50 extra out of pocket. If possible,  we recommend you accept the option to have the 3D mammogram performed.  It both reduces the number of women who will be called back for extra views which then turn out to be normal, and it is better than the routine mammogram at detecting truly abnormal areas.    COLON CANCER SCREENING: Now recommend starting at age 45. At this time colonoscopy is not covered for routine screening until 50. There are take home tests that can be done between 45-49.   COLONOSCOPY:  Colonoscopy to screen for colon cancer is recommended for all women at age 50.  We know, you hate the idea of the prep.  We agree, BUT, having colon cancer and not knowing it is worse!!  Colon cancer so often starts as a polyp that can be seen and removed at colonscopy, which can quite literally save your life!  And if your first colonoscopy is normal and you have no family history of colon cancer, most women don't have to have it again for   10 years.  Once every ten years, you can do something that may end up saving your life, right?  We will be happy to help you get it scheduled when you are ready.  Be sure to check your insurance coverage so you understand how much it will cost.  It may be covered as a preventative service at no cost, but you should check your particular policy.      Breast Self-Awareness Breast self-awareness means being familiar with how your breasts look and feel. It involves checking your breasts regularly and reporting any changes to your  health care provider. Practicing breast self-awareness is important. A change in your breasts can be a sign of a serious medical problem. Being familiar with how your breasts look and feel allows you to find any problems early, when treatment is more likely to be successful. All women should practice breast self-awareness, including women who have had breast implants. How to do a breast self-exam One way to learn what is normal for your breasts and whether your breasts are changing is to do a breast self-exam. To do a breast self-exam: Look for Changes  1. Remove all the clothing above your waist. 2. Stand in front of a mirror in a room with good lighting. 3. Put your hands on your hips. 4. Push your hands firmly downward. 5. Compare your breasts in the mirror. Look for differences between them (asymmetry), such as: ? Differences in shape. ? Differences in size. ? Puckers, dips, and bumps in one breast and not the other. 6. Look at each breast for changes in your skin, such as: ? Redness. ? Scaly areas. 7. Look for changes in your nipples, such as: ? Discharge. ? Bleeding. ? Dimpling. ? Redness. ? A change in position. Feel for Changes Carefully feel your breasts for lumps and changes. It is best to do this while lying on your back on the floor and again while sitting or standing in the shower or tub with soapy water on your skin. Feel each breast in the following way:  Place the arm on the side of the breast you are examining above your head.  Feel your breast with the other hand.  Start in the nipple area and make  inch (2 cm) overlapping circles to feel your breast. Use the pads of your three middle fingers to do this. Apply light pressure, then medium pressure, then firm pressure. The light pressure will allow you to feel the tissue closest to the skin. The medium pressure will allow you to feel the tissue that is a little deeper. The firm pressure will allow you to feel the tissue  close to the ribs.  Continue the overlapping circles, moving downward over the breast until you feel your ribs below your breast.  Move one finger-width toward the center of the body. Continue to use the  inch (2 cm) overlapping circles to feel your breast as you move slowly up toward your collarbone.  Continue the up and down exam using all three pressures until you reach your armpit.  Write Down What You Find  Write down what is normal for each breast and any changes that you find. Keep a written record with breast changes or normal findings for each breast. By writing this information down, you do not need to depend only on memory for size, tenderness, or location. Write down where you are in your menstrual cycle, if you are still menstruating. If you are having trouble noticing differences   in your breasts, do not get discouraged. With time you will become more familiar with the variations in your breasts and more comfortable with the exam. How often should I examine my breasts? Examine your breasts every month. If you are breastfeeding, the best time to examine your breasts is after a feeding or after using a breast pump. If you menstruate, the best time to examine your breasts is 5-7 days after your period is over. During your period, your breasts are lumpier, and it may be more difficult to notice changes. When should I see my health care provider? See your health care provider if you notice:  A change in shape or size of your breasts or nipples.  A change in the skin of your breast or nipples, such as a reddened or scaly area.  Unusual discharge from your nipples.  A lump or thick area that was not there before.  Pain in your breasts.  Anything that concerns you.  Vaginitis Vaginitis is a condition in which the vaginal tissue swells and becomes red (inflamed). This condition is most often caused by a change in the normal balance of bacteria and yeast that live in the vagina.  This change causes an overgrowth of certain bacteria or yeast, which causes the inflammation. There are different types of vaginitis, but the most common types are:  Bacterial vaginosis.  Yeast infection (candidiasis).  Trichomoniasis vaginitis. This is a sexually transmitted disease (STD).  Viral vaginitis.  Atrophic vaginitis.  Allergic vaginitis. What are the causes? The cause of this condition depends on the type of vaginitis. It can be caused by:  Bacteria (bacterial vaginosis).  Yeast, which is a fungus (yeast infection).  A parasite (trichomoniasis vaginitis).  A virus (viral vaginitis).  Low hormone levels (atrophic vaginitis). Low hormone levels can occur during pregnancy, breastfeeding, or after menopause.  Irritants, such as bubble baths, scented tampons, and feminine sprays (allergic vaginitis). Other factors can change the normal balance of the yeast and bacteria that live in the vagina. These include:  Antibiotic medicines.  Poor hygiene.  Diaphragms, vaginal sponges, spermicides, birth control pills, and intrauterine devices (IUD).  Sex.  Infection.  Uncontrolled diabetes.  A weakened defense (immune) system. What increases the risk? This condition is more likely to develop in women who:  Smoke.  Use vaginal douches, scented tampons, or scented sanitary pads.  Wear tight-fitting pants.  Wear thong underwear.  Use oral birth control pills or an IUD.  Have sex without a condom.  Have multiple sex partners.  Have an STD.  Frequently use the spermicide [id number].  Eat lots of foods high in sugar.  Have uncontrolled diabetes.  Have low estrogen levels.  Have a weakened immune system from an immune disorder or medical treatment.  Are pregnant or breastfeeding. What are the signs or symptoms? Symptoms vary depending on the cause of the vaginitis. Common symptoms include:  Abnormal vaginal discharge. ? The discharge is Ye[place], gray,  or yellow with bacterial vaginosis. ? The discharge is thick, [place], and cheesy with a yeast infection. ? The discharge is frothy and yellow or greenish with trichomoniasis.  A bad vaginal smell. The smell is fishy with bacterial vaginosis.  Vaginal itching, pain, or swelling.  Sex that is painful.  Pain or burning when urinating. Sometimes there are no symptoms. How is this diagnosed? This condition is diagnosed based on your symptoms and medical history. A physical exam, including a pelvic exam, will also be done. You may also have other   tests, including:  Tests to determine the pH level (acidity or alkalinity) of your vagina.  A whiff test, to assess the odor that results when a sample of your vaginal discharge is mixed with a potassium hydroxide solution.  Tests of vaginal fluid. A sample will be examined under a microscope. How is this treated? Treatment varies depending on the type of vaginitis you have. Your treatment may include:  Antibiotic creams or pills to treat bacterial vaginosis and trichomoniasis.  Antifungal medicines, such as vaginal creams or suppositories, to treat a yeast infection.  Medicine to ease discomfort if you have viral vaginitis. Your sexual partner should also be treated.  Estrogen delivered in a cream, pill, suppository, or vaginal ring to treat atrophic vaginitis. If vaginal dryness occurs, lubricants and moisturizing creams may help. You may need to avoid scented soaps, sprays, or douches.  Stopping use of a product that is causing allergic vaginitis. Then using a vaginal cream to treat the symptoms. Follow these instructions at home: Lifestyle  Keep your genital area clean and dry. Avoid soap, and only rinse the area with water.  Do not douche or use tampons until your health care provider says it is okay to do so. Use sanitary pads, if needed.  Do not have sex until your health care provider approves. When you can return to sex, practice  safe sex and use condoms.  Wipe from front to back. This avoids the spread of bacteria from the rectum to the vagina. General instructions  Take over-the-counter and prescription medicines only as told by your health care provider.  If you were prescribed an antibiotic medicine, take or use it as told by your health care provider. Do not stop taking or using the antibiotic even if you start to feel better.  Keep all follow-up visits as told by your health care provider. This is important. How is this prevented?  Use mild, non-scented products. Do not use things that can irritate the vagina, such as fabric softeners. Avoid the following products if they are scented: ? Feminine sprays. ? Detergents. ? Tampons. ? Feminine hygiene products. ? Soaps or bubble baths.  Let air reach your genital area. ? Wear cotton underwear to reduce moisture buildup. ? Avoid wearing underwear while you sleep. ? Avoid wearing tight pants and underwear or nylons without a cotton panel. ? Avoid wearing thong underwear.  Take off any wet clothing, such as bathing suits, as soon as possible.  Practice safe sex and use condoms. Contact a health care provider if:  You have abdominal pain.  You have a fever.  You have symptoms that last for more than 2-3 days. Get help right away if:  You have a fever and your symptoms suddenly get worse. Summary  Vaginitis is a condition in which the vaginal tissue becomes inflamed.This condition is most often caused by a change in the normal balance of bacteria and yeast that live in the vagina.  Treatment varies depending on the type of vaginitis you have. Do not douche, use tampons , or have sex until your health care pro Contraception Choices Contraception, also called birth control, refers to methods or devices that prevent pregnancy. Hormonal methods Contraceptive implant  A contraceptive implant is a thin, plastic tube that contains a hormone. It is  inserted into the upper part of the arm. It can remain in place for up to 3 years. Progestin-only injections Progestin-only injections are injections of progestin, a synthetic form of the hormone progesterone. They are  given every 3 months by a health care provider. Birth control pills  Birth control pills are pills that contain hormones that prevent pregnancy. They must be taken once a day, preferably at the same time each day. Birth control patch  The birth control patch contains hormones that prevent pregnancy. It is placed on the skin and must be changed once a week for three weeks and removed on the fourth week. A prescription is needed to use this method of contraception. Vaginal ring  A vaginal ring contains hormones that prevent pregnancy. It is placed in the vagina for three weeks and removed on the fourth week. After that, the process is repeated with a new ring. A prescription is needed to use this method of contraception. Emergency contraceptive Emergency contraceptives prevent pregnancy after unprotected sex. They come in pill form and can be taken up to 5 days after sex. They work best the sooner they are taken after having sex. Most emergency contraceptives are available without a prescription. This method should not be used as your only form of birth control. Barrier methods Female condom  A female condom is a thin sheath that is worn over the penis during sex. Condoms keep sperm from going inside a woman's body. They can be used with a spermicide to increase their effectiveness. They should be disposed after a single use. Female condom  A female condom is a soft, loose-fitting sheath that is put into the vagina before sex. The condom keeps sperm from going inside a woman's body. They should be disposed after a single use. Diaphragm  A diaphragm is a soft, dome-shaped barrier. It is inserted into the vagina before sex, along with a spermicide. The diaphragm blocks sperm from  entering the uterus, and the spermicide kills sperm. A diaphragm should be left in the vagina for 6-8 hours after sex and removed within 24 hours. A diaphragm is prescribed and fitted by a health care provider. A diaphragm should be replaced every 1-2 years, after giving birth, after gaining more than 15 lb (6.8 kg), and after pelvic surgery. Cervical cap  A cervical cap is a round, soft latex or plastic cup that fits over the cervix. It is inserted into the vagina before sex, along with spermicide. It blocks sperm from entering the uterus. The cap should be left in place for 6-8 hours after sex and removed within 48 hours. A cervical cap must be prescribed and fitted by a health care provider. It should be replaced every 2 years. Sponge  A sponge is a soft, circular piece of polyurethane foam with spermicide on it. The sponge helps block sperm from entering the uterus, and the spermicide kills sperm. To use it, you make it wet and then insert it into the vagina. It should be inserted before sex, left in for at least 6 hours after sex, and removed and thrown away within 30 hours. Spermicides Spermicides are chemicals that kill or block sperm from entering the cervix and uterus. They can come as a cream, jelly, suppository, foam, or tablet. A spermicide should be inserted into the vagina with an applicator at least 10-15 minutes before sex to allow time for it to work. The process must be repeated every time you have sex. Spermicides do not require a prescription. Intrauterine contraception Intrauterine device (IUD) An IUD is a T-shaped device that is put in a woman's uterus. There are two types: Hormone IUD.This type contains progestin, a synthetic form of the hormone progesterone. This type  can stay in place for 3-5 years. Copper IUD.This type is wrapped in copper wire. It can stay in place for 10 years.  Permanent methods of contraception Female tubal ligation In this method, a woman's fallopian  tubes are sealed, tied, or blocked during surgery to prevent eggs from traveling to the uterus. Hysteroscopic sterilization In this method, a small, flexible insert is placed into each fallopian tube. The inserts cause scar tissue to form in the fallopian tubes and block them, so sperm cannot reach an egg. The procedure takes about 3 months to be effective. Another form of birth control must be used during those 3 months. Female sterilization This is a procedure to tie off the tubes that carry sperm (vasectomy). After the procedure, the man can still ejaculate fluid (semen). Natural planning methods Natural family planning In this method, a couple does not have sex on days when the woman could become pregnant. Calendar method This means keeping track of the length of each menstrual cycle, identifying the days when pregnancy can happen, and not having sex on those days. Ovulation method In this method, a couple avoids sex during ovulation. Symptothermal method This method involves not having sex during ovulation. The woman typically checks for ovulation by watching changes in her temperature and in the consistency of cervical mucus. Post-ovulation method In this method, a couple waits to have sex until after ovulation. Summary Contraception, also called birth control, means methods or devices that prevent pregnancy. Hormonal methods of contraception include implants, injections, pills, patches, vaginal rings, and emergency contraceptives. Barrier methods of contraception can include female condoms, female condoms, diaphragms, cervical caps, sponges, and spermicides. There are two types of IUDs (intrauterine devices). An IUD can be put in a woman's uterus to prevent pregnancy for 3-5 years. Permanent sterilization can be done through a procedure for males, females, or both. Natural family planning methods involve not having sex on days when the woman could become pregnant. This information is not  intended to replace advice given to you by your health care provider. Make sure you discuss any questions you have with your health care provider. Document Revised: 06/24/2017 Document Reviewed: 07/25/2016 Elsevier Patient Education  2020 [employer].  vider approves. When you can return to sex, practice safe sex and use condoms. This information is not intended to replace advice given to you by your health care provider. Make sure you discuss any questions you have with your health care provider. Document Revised: 06/04/2017 Document Reviewed: 07/28/2016 Elsevier Patient Education  2020 [employer].

## 2019-09-01 NOTE — Progress Notes (Signed)
29 y.o. [id number] Divorced Black or African American Not Hispanic or Latino female here for annual exam.   Period Cycle (Days): (cycles last 2-3 weeks) Period Pattern: (!) Irregular Menstrual Flow: Heavy Menstrual Control: Maxi pad, Tampon Dysmenorrhea: (!) Moderate Dysmenorrhea Symptoms: Cramping  She had her IUD placed in 7/19. Initially did fine for 6 months.  Over the last year she has had very irregular bleeding. She can bleed for 2-3 weeks straight or just have random spotting. She can go through a super tampon in 1.5 hours. Cramps can be very bad. Prior to the IUD monthly cycle x 7 days, not as heavy as it is now. She did have cramps, not as severe.  Sexually active, same partner since 11/19. Positional dysmenorrhea.  Recent labs, not anemic. Negative STD testing.   She had a pelvic ultrasound earlier this month. Uterus measured 12.2 x 5.3 x 4.8 cm. Stripe 11 mm, IUD not in proper position, in the LUS. Normal ovaries.    She was started on Augmentin on 08/23/19 for Colitis. She started having diarrhea 2 days ago, 4-5 x a day, liquid. No fevers.   She c/o a yellow d/c with odor since the IUD was inserted.   Patient's last menstrual period was 08/25/2019 (exact date).          Sexually active: Yes.    The current method of family planning is IUD.    Exercising: No.  exercise Smoker:  no  Health Maintenance: Pap:  03-02-18 neg (care everywhere) History of abnormal Pap:  Yes at 20, no surgery on her cervix.  MMG:  none BMD:  none Colonoscopy: none TDaP:  Maybe during pregnancy Gardasil: unsure, doesn't think she had it. No records.    reports that she has never smoked. She has never used smokeless tobacco. She reports current alcohol use. She reports that she does not use drugs. She drinks on the weekends, 2 at a time. Works at [employer], has to do heavy lifting, causes pain in her lower back. Kids are 6 and 2.   Past Medical History:  Diagnosis Date  . Abnormal Pap smear of cervix    . Anemia   . Ovarian cyst   . STD (sexually transmitted disease)    HPV    Past Surgical History:  Procedure Laterality Date  . CESAREAN SECTION    . INTRAUTERINE DEVICE (IUD) INSERTION     mirena iud inserted around 7/19    Current Outpatient Medications  Medication Sig Dispense Refill  . amoxicillin-clavulanate (AUGMENTIN) 875-125 MG tablet Take 1 tablet by mouth every 12 (twelve) hours. 14 tablet 0  . ibuprofen (ADVIL) 800 MG tablet Take 1 tablet (800 mg total) by mouth every 8 (eight) hours as needed. 30 tablet 0  . Multiple Vitamins-Minerals (MULTIVITAMIN WITH MINERALS) tablet Take 1 tablet by mouth daily.     No current facility-administered medications for this visit.    Family History  Problem Relation Age of Onset  . Diabetes Mother   . Diabetes Father   . Diabetes Sister     Review of Systems  Constitutional: Negative.   HENT: Negative.   Eyes: Negative.   Respiratory: Negative.   Cardiovascular: Negative.   Gastrointestinal: Negative.   Genitourinary: Positive for vaginal bleeding.       Irregular cycle with IUD  Musculoskeletal: Negative.   Skin: Negative.   Neurological: Negative.   Psychiatric/Behavioral: Negative.     Exam:   BP 110/62   Pulse 68  Temp (!) 97.5 F (36.4 C) (Skin)   Resp 16   Ht 5' 2.25" (1.581 m)   Wt 140 lb (63.5 kg)   LMP 08/25/2019 (Exact Date)   BMI 25.40 kg/m   Weight change: @WEIGHTCHANGE @ Height:   Height: 5' 2.25" (158.1 cm)  Ht Readings from Last 3 Encounters:  09/01/19 5' 2.25" (1.581 m)  08/23/19 5\' 2"  (1.575 m)  08/18/19 5\' 2"  (1.575 m)    General appearance: alert, cooperative and appears stated age Head: Normocephalic, without obvious abnormality, atraumatic Neck: no adenopathy, supple, symmetrical, trachea midline and thyroid normal to inspection and palpation Lungs: clear to auscultation bilaterally Cardiovascular: regular rate and rhythm Breasts: normal appearance, no masses or tenderness Abdomen:  soft, non-tender; non distended,  no masses,  no organomegaly Extremities: extremities normal, atraumatic, no cyanosis or edema Skin: Skin color, texture, turgor normal. No rashes or lesions Lymph nodes: Cervical, supraclavicular, and axillary nodes normal. No abnormal inguinal nodes palpated Neurologic: Grossly normal   Pelvic: External genitalia:  no lesions              Urethra:  normal appearing urethra with no masses, tenderness or lesions              Bartholins and Skenes: normal                 Vagina: normal appearing vagina with normal color and discharge, no lesions              Cervix: no cervical motion tenderness, no lesions and IUD string 5 cm. IUD removed with ringed forceps               Bimanual Exam:  Uterus:  anteverted, normal sized, mobile, mildly tender              Adnexa: no mass, fullness, tenderness               Rectovaginal: Confirms               Anus:  normal sphincter tone, no lesions  Evern Core chaperoned for the exam.  Wet prep: + clue, no trich, rare wbc KOH: no yeast PH: 5   A:  Well Woman with normal exam  Contraception counseling  Malpositioned IUD  Vaginal odor, BV  Back pain and diarrhea, will f/u with primary. Names given  P:   No pap this year  Recent negative STD testing  Labs UTD  Discussed breast self exam  IUD removed  Will return for nexplanon insertion next week   No intercourse until nexplanon insertion  Condom use encouraged  Treat with metrogel  Start Gardasil series

## 2019-09-01 NOTE — Addendum Note (Signed)
Addended by: Isabell Jarvis on: 09/01/2019 05:13 PM   Modules accepted: Orders

## 2019-09-06 ENCOUNTER — Encounter: Payer: Self-pay | Admitting: Obstetrics and Gynecology

## 2019-09-06 ENCOUNTER — Ambulatory Visit (INDEPENDENT_AMBULATORY_CARE_PROVIDER_SITE_OTHER): Payer: Managed Care, Other (non HMO) | Admitting: Obstetrics and Gynecology

## 2019-09-06 ENCOUNTER — Other Ambulatory Visit: Payer: Self-pay

## 2019-09-06 VITALS — BP 110/60 | HR 100 | Temp 98.4°F | Ht 62.5 in | Wt 142.0 lb

## 2019-09-06 DIAGNOSIS — Z01812 Encounter for preprocedural laboratory examination: Secondary | ICD-10-CM

## 2019-09-06 DIAGNOSIS — Z30017 Encounter for initial prescription of implantable subdermal contraceptive: Secondary | ICD-10-CM

## 2019-09-06 LAB — POCT URINE PREGNANCY: Preg Test, Ur: NEGATIVE

## 2019-09-06 NOTE — Progress Notes (Signed)
GYNECOLOGY  VISIT   HPI: 29 y.o.   Divorced Black or [place] American Not Hispanic or Latino  female   [phone] with Patient's last menstrual period was 08/28/2019 (approximate).   here for nexplanon insertion     GYNECOLOGIC HISTORY: Patient's last menstrual period was 08/28/2019 (approximate). Contraception:none Menopausal hormone therapy: none         OB History    Gravida  3   Para  2   Term  2   Preterm      AB  1   Living  2     SAB  1   TAB      Ectopic      Multiple      Live Births                 There are no problems to display for this patient.   Past Medical History:  Diagnosis Date  . Abnormal Pap smear of cervix   . Anemia   . Ovarian cyst   . STD (sexually transmitted disease)    HPV    Past Surgical History:  Procedure Laterality Date  . CESAREAN SECTION    . INTRAUTERINE DEVICE (IUD) INSERTION     mirena iud inserted around 7/19    Current Outpatient Medications  Medication Sig Dispense Refill  . ibuprofen (ADVIL) 800 MG tablet Take 1 tablet (800 mg total) by mouth every 8 (eight) hours as needed. 30 tablet 0  . metroNIDAZOLE (METROGEL) 0.75 % vaginal gel Place 1 Applicatorful vaginally at bedtime. Use for 5 nights. 70 g 0  . Multiple Vitamins-Minerals (MULTIVITAMIN WITH MINERALS) tablet Take 1 tablet by mouth daily.     No current facility-administered medications for this visit.     ALLERGIES: Patient has no known allergies.  Family History  Problem Relation Age of Onset  . Diabetes Mother   . Diabetes Father   . Diabetes Sister     Social History   Socioeconomic History  . Marital status: Divorced    Spouse name: Not on file  . Number of children: Not on file  . Years of education: Not on file  . Highest education level: Not on file  Occupational History  . Not on file  Tobacco Use  . Smoking status: Never Smoker  . Smokeless tobacco: Never Used  Substance and Sexual Activity  . Alcohol use: Yes   Comment: occassionally  . Drug use: No  . Sexual activity: Yes    Partners: Male    Birth control/protection: I.U.D.  Other Topics Concern  . Not on file  Social History Narrative  . Not on file   Social Determinants of Health   Financial Resource Strain:   . Difficulty of Paying Living Expenses: Not on file  Food Insecurity:   . Worried About Charity fundraiser in the Last Year: Not on file  . Ran Out of Food in the Last Year: Not on file  Transportation Needs:   . Lack of Transportation (Medical): Not on file  . Lack of Transportation (Non-Medical): Not on file  Physical Activity:   . Days of Exercise per Week: Not on file  . Minutes of Exercise per Session: Not on file  Stress:   . Feeling of Stress : Not on file  Social Connections:   . Frequency of Communication with Friends and Family: Not on file  . Frequency of Social Gatherings with Friends and Family: Not on file  . Attends Religious  Services: Not on file  . Active Member of Clubs or Organizations: Not on file  . Attends Banker Meetings: Not on file  . Marital Status: Not on file  Intimate Partner Violence:   . Fear of Current or Ex-Partner: Not on file  . Emotionally Abused: Not on file  . Physically Abused: Not on file  . Sexually Abused: Not on file    Review of Systems  All other systems reviewed and are negative.   PHYSICAL EXAMINATION:    BP 110/60   Pulse 100   Temp 98.4 F (36.9 C)   Ht 5' 2.5" (1.588 m)   Wt 142 lb (64.4 kg)   LMP 08/28/2019 (Approximate)   SpO2 98%   BMI 25.56 kg/m     General appearance: alert, cooperative and appears stated age  Risks of nexplanon removal and reinsertion were reviewed with the patient and a consent was signed.  The patient was placed in the supine position with her left arm bent at the elbow. The area was cleansed with betadine and injected with 1% lidocaine along the path where the new nexplanon would be placed. The nexplanon device was  inserted in the usual fashion without difficulty. Slight oozing from the insertion site was stopped with pressure. The device was palpated in place.  The patients arm was cleansed of betadine and a steri strip was placed over the incision. A gauze was wrapped around her arm.  She tolerated the procedure well  Instructions for care were discussed.    Chaperone was present for exam.  ASSESSMENT Contraception management    PLAN Neplanon inserted Instructed to use back up contraception for one week Also recommended continued use of condoms for STD protection.    An After Visit Summary was printed and given to the patient.

## 2019-09-06 NOTE — Patient Instructions (Signed)
Nexplanon Instructions After Insertion  Keep bandage clean and dry for 24 hours  May use ice/Tylenol/Ibuprofen for soreness or pain  If you develop fever, drainage or increased warmth from incision site-contact office immediately   

## 2019-09-14 ENCOUNTER — Emergency Department (HOSPITAL_COMMUNITY)
Admission: EM | Admit: 2019-09-14 | Discharge: 2019-09-14 | Disposition: A | Discharge status: Home / Self Care | Payer: Managed Care, Other (non HMO) | Attending: Emergency Medicine | Admitting: Emergency Medicine

## 2019-09-14 ENCOUNTER — Other Ambulatory Visit: Payer: Self-pay

## 2019-09-14 ENCOUNTER — Emergency Department (HOSPITAL_COMMUNITY): Payer: Managed Care, Other (non HMO)

## 2019-09-14 ENCOUNTER — Encounter (HOSPITAL_COMMUNITY): Payer: Self-pay | Admitting: Emergency Medicine

## 2019-09-14 DIAGNOSIS — R21 Rash and other nonspecific skin eruption: Secondary | ICD-10-CM | POA: Insufficient documentation

## 2019-09-14 DIAGNOSIS — R55 Syncope and collapse: Secondary | ICD-10-CM | POA: Diagnosis present

## 2019-09-14 DIAGNOSIS — N899 Noninflammatory disorder of vagina, unspecified: Secondary | ICD-10-CM | POA: Diagnosis not present

## 2019-09-14 DIAGNOSIS — Z79899 Other long term (current) drug therapy: Secondary | ICD-10-CM | POA: Diagnosis not present

## 2019-09-14 DIAGNOSIS — R509 Fever, unspecified: Secondary | ICD-10-CM | POA: Insufficient documentation

## 2019-09-14 DIAGNOSIS — Z20822 Contact with and (suspected) exposure to covid-19: Secondary | ICD-10-CM | POA: Insufficient documentation

## 2019-09-14 LAB — SARS CORONAVIRUS 2 (TAT 6-24 HRS): SARS Coronavirus 2: NEGATIVE

## 2019-09-14 LAB — COMPREHENSIVE METABOLIC PANEL
ALT: 22 U/L (ref 0–44)
AST: 25 U/L (ref 15–41)
Albumin: 4.1 g/dL (ref 3.5–5.0)
Alkaline Phosphatase: 46 U/L (ref 38–126)
Anion gap: 10 (ref 5–15)
BUN: 11 mg/dL (ref 6–20)
CO2: 23 mmol/L (ref 22–32)
Calcium: 9.9 mg/dL (ref 8.9–10.3)
Chloride: 104 mmol/L (ref 98–111)
Creatinine, Ser: 0.72 mg/dL (ref 0.44–1.00)
GFR calc Af Amer: >60 mL/min (ref >60)
GFR calc non Af Amer: >60 mL/min (ref >60)
Glucose, Bld: 139 mg/dL — ABNORMAL HIGH (ref 70–99)
Potassium: 4.1 mmol/L (ref 3.5–5.1)
Sodium: 137 mmol/L (ref 135–145)
Total Bilirubin: 0.9 mg/dL (ref 0.3–1.2)
Total Protein: 7.9 g/dL (ref 6.5–8.1)

## 2019-09-14 LAB — URINALYSIS, ROUTINE W REFLEX MICROSCOPIC
Bilirubin Urine: NEGATIVE
Glucose, UA: NEGATIVE mg/dL
Hgb urine dipstick: NEGATIVE
Ketones, ur: NEGATIVE mg/dL
Leukocytes,Ua: NEGATIVE
Nitrite: NEGATIVE
Protein, ur: 30 mg/dL — AB
Specific Gravity, Urine: 1.023 (ref 1.005–1.030)
pH: 9 — ABNORMAL HIGH (ref 5.0–8.0)

## 2019-09-14 LAB — CBC WITH DIFFERENTIAL/PLATELET
Abs Immature Granulocytes: 0.02 10*3/uL (ref 0.00–0.07)
Basophils Absolute: 0 10*3/uL (ref 0.0–0.1)
Basophils Relative: 0 %
Eosinophils Absolute: 0 10*3/uL (ref 0.0–0.5)
Eosinophils Relative: 0 %
HCT: 38.9 % (ref 36.0–46.0)
Hemoglobin: 12.8 g/dL (ref 12.0–15.0)
Immature Granulocytes: 0 %
Lymphocytes Relative: 6 %
Lymphs Abs: 0.4 10*3/uL — ABNORMAL LOW (ref 0.7–4.0)
MCH: 30.3 pg (ref 26.0–34.0)
MCHC: 32.9 g/dL (ref 30.0–36.0)
MCV: 92.2 fL (ref 80.0–100.0)
Monocytes Absolute: 0.1 10*3/uL (ref 0.1–1.0)
Monocytes Relative: 1 %
Neutro Abs: 6.5 10*3/uL (ref 1.7–7.7)
Neutrophils Relative %: 93 %
Platelets: 292 10*3/uL (ref 150–400)
RBC: 4.22 MIL/uL (ref 3.87–5.11)
RDW: 13.3 % (ref 11.5–15.5)
WBC: 7 10*3/uL (ref 4.0–10.5)
nRBC: 0 % (ref 0.0–0.2)

## 2019-09-14 LAB — POC URINE PREG, ED: Preg Test, Ur: NEGATIVE

## 2019-09-14 LAB — POC SARS CORONAVIRUS 2 AG -  ED: SARS Coronavirus 2 Ag: NEGATIVE

## 2019-09-14 MED ORDER — DIPHENHYDRAMINE HCL 50 MG/ML IJ SOLN
25.0000 mg | Freq: Once | INTRAMUSCULAR | Status: AC
  Administered 2019-09-14: 16:00:00 25 mg via INTRAVENOUS
  Filled 2019-09-14: qty 1

## 2019-09-14 MED ORDER — LORATADINE 10 MG PO TABS: 10.0000 mg | ORAL_TABLET | Freq: Every day | ORAL | 0 refills | Status: DC

## 2019-09-14 MED ORDER — PREDNISONE 10 MG (21) PO TBPK: ORAL_TABLET | Freq: Every day | ORAL | 0 refills | Status: DC

## 2019-09-14 MED ORDER — ACETAMINOPHEN 325 MG PO TABS
650.0000 mg | ORAL_TABLET | Freq: Once | ORAL | Status: AC
  Administered 2019-09-14: 16:00:00 650 mg via ORAL
  Filled 2019-09-14: qty 2

## 2019-09-14 MED ORDER — SODIUM CHLORIDE 0.9 % IV BOLUS
1000.0000 mL | Freq: Once | INTRAVENOUS | Status: AC
  Administered 2019-09-14: 16:00:00 1000 mL via INTRAVENOUS

## 2019-09-14 MED ORDER — FAMOTIDINE 20 MG PO TABS: 20.0000 mg | ORAL_TABLET | Freq: Two times a day (BID) | ORAL | 0 refills | Status: DC

## 2019-09-14 MED ORDER — PREDNISONE 20 MG PO TABS
60.0000 mg | ORAL_TABLET | Freq: Once | ORAL | Status: DC
  Filled 2019-09-14: qty 3

## 2019-09-14 MED ORDER — FAMOTIDINE IN NACL 20-0.9 MG/50ML-% IV SOLN
20.0000 mg | Freq: Once | INTRAVENOUS | Status: AC
  Administered 2019-09-14: 16:00:00 20 mg via INTRAVENOUS
  Filled 2019-09-14: qty 50

## 2019-09-14 NOTE — ED Notes (Signed)
Informed Dr. Rush Landmark of POC Coronavirus result

## 2019-09-14 NOTE — ED Provider Notes (Signed)
[facility] EMERGENCY DEPARTMENT Provider Note   CSN: [id number] Arrival date & time: 09/14/19  1447     History Chief Complaint  Patient presents with  . Multiple Complaints    Margaret Mora is a 29 y.o. female.  HPI   Patient is a 29 year old female with a history of anemia, ovarian cysts, HPV, who presents the emergency department today for evaluation of multiple complaints.  Patient states that yesterday she was getting ready for work and was standing in her bathroom when all of a sudden she felt very dizzy and had a syncopal episode.  She has a history of syncope when she has been anemic in the past.  She states that yesterday she also broke out into a rash.  She took Benadryl last night and was able to sleep but this morning woke up and the rash was again present to her bilateral arms and legs.  It is erythematous and itchy.  She denies any new soaps, detergents, lotions, foods or medications.  The only recent change she has had is that she had her IUD removed and got a Nexplanon implanted into her left upper extremity.  She does state that a few days ago the left upper extremity developed a blister and she had some serosanguinous drainage come out. It has now scabbed over.    She further notes she has had fevers at home up to 101F. She denies URI sxs, chest pain, sob, abd pain, pelvic pain, NVD, dysuria, frequency, or urgency. Reports an episode of vaginal discharge that occurred today but has since resolved. She denies concern for STD. She states that she has some pain to the clitoris when she uses the restroom. She recently had negative STD screening.   She states that her son is sick with rhinorrhea, congestion, and cough. He has not been tested for COVID.   She was seen at her PCP office at [facility] earlier today for the rash. She was given a steroid injection at that time and had labs drawn. She states she will get results tomorrow. States sxs  worsened so she came to the ED.   Past Medical History:  Diagnosis Date  . Abnormal Pap smear of cervix   . Anemia   . Ovarian cyst   . STD (sexually transmitted disease)    HPV    There are no problems to display for this patient.   Past Surgical History:  Procedure Laterality Date  . CESAREAN SECTION    . INTRAUTERINE DEVICE (IUD) INSERTION     mirena iud inserted around 7/19     OB History    Gravida  3   Para  2   Term  2   Preterm      AB  1   Living  2     SAB  1   TAB      Ectopic      Multiple      Live Births              Family History  Problem Relation Age of Onset  . Diabetes Mother   . Diabetes Father   . Diabetes Sister     Social History   Tobacco Use  . Smoking status: Never Smoker  . Smokeless tobacco: Never Used  Substance Use Topics  . Alcohol use: Yes    Comment: occassionally  . Drug use: No    Home Medications Prior to Admission medications   Medication  Sig Start Date End Date Taking? Authorizing Provider  diphenhydrAMINE (BENADRYL) 25 MG tablet Take 25 mg by mouth as needed for allergies.   Yes [provider]  Multiple Vitamins-Minerals (MULTIVITAMIN WITH MINERALS) tablet Take 1 tablet by mouth daily.   Yes [provider]  famotidine (PEPCID) 20 MG tablet Take 1 tablet (20 mg total) by mouth 2 (two) times daily for 5 days. 09/14/19 09/19/19  Dovid Bartko S, PA-C  ibuprofen (ADVIL) 800 MG tablet Take 1 tablet (800 mg total) by mouth every 8 (eight) hours as needed. Patient not taking: Reported on 09/14/2019 08/18/19   Jose Persia, MD  loratadine (CLARITIN) 10 MG tablet Take 1 tablet (10 mg total) by mouth daily for 5 days. 09/14/19 09/19/19  Clairessa Boulet S, PA-C  predniSONE (STERAPRED UNI-PAK 21 TAB) 10 MG (21) TBPK tablet Take by mouth daily. Take 6 tabs by mouth daily  for 2 days, then 5 tabs for 2 days, then 4 tabs for 2 days, then 3 tabs for 2 days, 2 tabs for 2 days, then 1 tab by mouth  daily for 2 days 09/14/19   Okie Jansson S, PA-C    Allergies    Patient has no known allergies.  Review of Systems   Review of Systems  Constitutional: Positive for chills, fatigue and fever.  HENT: Negative for ear pain and sore throat.   Eyes: Negative for pain and visual disturbance.  Respiratory: Negative for cough and shortness of breath.   Cardiovascular: Negative for chest pain and palpitations.  Gastrointestinal: Negative for abdominal pain, constipation, diarrhea, nausea and vomiting.  Genitourinary: Positive for vaginal discharge. Negative for decreased urine volume, dysuria, flank pain, frequency, hematuria, pelvic pain, urgency and vaginal bleeding.       Pain to clitoris  Musculoskeletal: Positive for myalgias.  Skin: Positive for rash.  Neurological: Negative for seizures and syncope.  All other systems reviewed and are negative.   Physical Exam Updated Vital Signs BP 120/70   Pulse 98   Temp 98.4 F (36.9 C) (Oral)   Resp 18   Ht 5\' 2"  (1.575 m)   Wt 65.8 kg   LMP 08/28/2019 (Approximate)   SpO2 99%   BMI 26.52 kg/m   Physical Exam Vitals and nursing note reviewed.  Constitutional:      General: She is not in acute distress.    Appearance: She is well-developed. She is diaphoretic.  HENT:     Head: Normocephalic and atraumatic.  Eyes:     Extraocular Movements: Extraocular movements intact.     Conjunctiva/sclera: Conjunctivae normal.     Pupils: Pupils are equal, round, and reactive to light.     Comments: Denies nystagmus  Cardiovascular:     Rate and Rhythm: Regular rhythm. Tachycardia present.     Pulses: Normal pulses.     Heart sounds: Normal heart sounds. No murmur.  Pulmonary:     Effort: Pulmonary effort is normal. No respiratory distress.     Breath sounds: Normal breath sounds. No wheezing, rhonchi or rales.  Abdominal:     General: Bowel sounds are normal. There is no distension.     Palpations: Abdomen is soft.     Tenderness:  There is no abdominal tenderness. There is no guarding or rebound.  Genitourinary:    Comments: Pt declined Musculoskeletal:     Cervical back: Neck supple.     Comments: Scab to the LUE where nexplanon is in place. There is no ttp, erythema, swelling, fluctuance or  induration  Skin:    General: Skin is warm.     Findings: Rash (erythematous, maculopapular rash to the bilat forearms and posterior upper thighs (pictured below)) present.  Neurological:     Mental Status: She is alert.     Comments: Mental Status:  Alert, thought content appropriate, able to give a coherent history. Speech fluent without evidence of aphasia. Able to follow 2 step commands without difficulty.  Cranial Nerves:  II: pupils equal, round, reactive to light III,IV, VI: ptosis not present, extra-ocular motions intact bilaterally  V,VII: smile symmetric, facial light touch sensation equal VIII: hearing grossly normal to voice  X: uvula elevates symmetrically  XI: bilateral shoulder shrug symmetric and strong XII: midline tongue extension without fassiculations Motor:  Normal tone. 5/5 strength of BUE and BLE major muscle groups including strong and equal grip strength and dorsiflexion/plantar flexion Sensory: light touch normal in all extremities. Gait: normal gait and balance.        ED Results / Procedures / Treatments   Labs (all labs ordered are listed, but only abnormal results are displayed) Labs Reviewed  CBC WITH DIFFERENTIAL/PLATELET - Abnormal; Notable for the following components:      Result Value   Lymphs Abs 0.4 (*)    All other components within normal limits  COMPREHENSIVE METABOLIC PANEL - Abnormal; Notable for the following components:   Glucose, Bld 139 (*)    All other components within normal limits  URINALYSIS, ROUTINE W REFLEX MICROSCOPIC - Abnormal; Notable for the following components:   APPearance CLOUDY (*)    pH 9.0 (*)    Protein, ur 30 (*)    Bacteria, UA FEW (*)     All other components within normal limits  SARS CORONAVIRUS 2 (TAT 6-24 HRS)  RPR  POC URINE PREG, ED  POC SARS CORONAVIRUS 2 AG -  ED    EKG None  Radiology CT Head Wo Contrast  Result Date: 09/14/2019 CLINICAL DATA:  Posttraumatic headache. Weakness and fatigue. Rash. EXAM: CT HEAD WITHOUT CONTRAST TECHNIQUE: Contiguous axial images were obtained from the base of the skull through the vertex without intravenous contrast. COMPARISON:  Head CT 06/10/2018 FINDINGS: Brain: No intracranial hemorrhage, mass effect, or midline shift. No hydrocephalus. The basilar cisterns are patent. No evidence of territorial infarct or acute ischemia. No extra-axial or intracranial fluid collection. Vascular: No hyperdense vessel or unexpected calcification. Skull: No fracture or focal lesion. Sinuses/Orbits: Paranasal sinuses and mastoid air cells are clear. The visualized orbits are unremarkable. Other: None. IMPRESSION: Negative head CT. Electronically Signed   By: Narda Rutherford M.D.   On: 09/14/2019 18:03    Procedures Procedures (including critical care time)  Medications Ordered in ED Medications  predniSONE (DELTASONE) tablet 60 mg (0 mg Oral Hold 09/14/19 1629)  acetaminophen (TYLENOL) tablet 650 mg (650 mg Oral Given 09/14/19 1625)  sodium chloride 0.9 % bolus 1,000 mL (1,000 mLs Intravenous New Bag/Given 09/14/19 1611)  famotidine (PEPCID) IVPB 20 mg premix (0 mg Intravenous Stopped 09/14/19 1721)  diphenhydrAMINE (BENADRYL) injection 25 mg (25 mg Intravenous Given 09/14/19 1626)    ED Course  I have reviewed the triage vital signs and the nursing notes.  Pertinent labs & imaging results that were available during my care of the patient were reviewed by me and considered in my medical decision making (see chart for details).    MDM Rules/Calculators/A&P  29 year old female presenting for evaluation of multiple complaints with a syncopal episode yesterday and developed a  rash.  She has also been fatigued for about a week and has had fevers.  She is also complaining of some vaginal complaints.  Syncopal episode: Had syncopal episode yesterday that sounds vasovagal in nature.  She did fall to the floor and is unclear if she has had head trauma though has felt fatigued.  Patient is neuro intact.  Given possible head trauma, CT scan obtained.  Imaging was reviewed/interpreted by me - neg for acute intracranial abnormality.  She is initially tachycardic and is also orthostatic on exam.  She appears to be dehydrated on exam so I think that this is likely contributing and I think that syncope was likely related to vasovagal episode.  EKG was sinus tach with no ischemic changes.  She does not have any chest pain or shortness of breath to suggest PE as the etiology of her syncopal episode.  She has no arrhythmia on her EKG.  Suspect benign cause of her syncope.  Rash: Rash appears to be consistent with a contact dermatitis.  She was given, Benadryl and Pepcid in the ED and on reassessment the rash has improved.  She denies any environmental changes that she is aware of.  She has had no new medications.  We will give her a steroid taper for her symptoms and have her monitor symptoms closely.  She does not have any evidence of anaphylaxis today.  Febrile illness: afebrile here. Nontoxic nonseptic appearing.  Reviewed/interpreted labs: CBC Without leukocytosis or anemia.  CMP with normal electrolytes kidney and liver function.  UA with proteinuria.  No leukocytes or nitrites to suggest UTI.  Urine pregnancy negative.  Covid test was initially negative.  Send out Covid test was obtained and pending at the time of discharge.  I do have increased suspicion for this given that she states her son currently has URI symptoms and has not been tested for Covid.  This could explain the majority of her symptoms..   Vaginal complaints: Patient complaining of pain to the clitoris.  She had STD  screening earlier this month that was all negative.  I offered to complete a pelvic exam and repeat STD screening however she declines at this time and would not like to have further work-up for these complaints today.  Patient's work-up is grossly reassuring today.  She has received fluids and medications in the ED and on reassessment she does report some improvement.  Her heart rate is certainly improved since she arrived.  Advised on close follow-up and return precautions.  She voices understanding of the plan and reasons to return.  All questions answered.  Patient stable for discharge.   ---  Margaret Mora was evaluated in Emergency Department on 09/14/2019 for the symptoms described in the history of present illness. She was evaluated in the context of the global COVID-19 pandemic, which necessitated consideration that the patient might be at risk for infection with the SARS-CoV-2 virus that causes COVID-19. Institutional protocols and algorithms that pertain to the evaluation of patients at risk for COVID-19 are in a state of rapid change based on information released by regulatory bodies including the CDC and federal and state organizations. These policies and algorithms were followed during the patient's care in the ED.   Final Clinical Impression(s) / ED Diagnoses Final diagnoses:  Rash  Syncope, unspecified syncope type  Febrile illness  Person under investigation for COVID-19    Rx /  DC Orders ED Discharge Orders         Ordered    predniSONE (STERAPRED UNI-PAK 21 TAB) 10 MG (21) TBPK tablet  Daily     09/14/19 1833    famotidine (PEPCID) 20 MG tablet  2 times daily     09/14/19 1833    loratadine (CLARITIN) 10 MG tablet  Daily     09/14/19 1833           Karrie MeresCouture, Raman Featherston S, PA-C 09/14/19 1834    Tegeler, Canary Brimhristopher J, MD 09/15/19 [phone]

## 2019-09-14 NOTE — ED Triage Notes (Addendum)
Pt to ED with multiple complaints.  Pt c/o fatigue, weakness, rash on bil arms and legs  Was at MD's office this am and given a injection for same.  Pt st's they also got blood work but doesn't get results until tomorrow.  Pt also c/o pelvic pain.  Also c/o ongoing fevers at home

## 2019-09-14 NOTE — Discharge Instructions (Addendum)
You may rotate Tylenol and Motrin for your fevers.  You were given steroid and Pepcid to help with the itching.  You were also given prescription for Claritin to take for your allergic reaction.  Please make an appointment with your regular doctor in the next few days for reassessment and return to the ED for new or worsening symptoms.  You were tested for Covid.  The results will be available in the next 1 to 2 days.  If the results are positive the hospital will contact you and let you know.  If the results are negative you will not be contacted.  If the results are positive, You should be isolated for at least 7 days since the onset of your symptoms AND >72 hours after symptoms resolution (absence of fever without the use of fever reducing medicaiton and improvement in respiratory symptoms), whichever is longer

## 2019-09-15 LAB — RPR: RPR Ser Ql: NONREACTIVE

## 2019-09-18 ENCOUNTER — Ambulatory Visit (INDEPENDENT_AMBULATORY_CARE_PROVIDER_SITE_OTHER): Payer: Managed Care, Other (non HMO) | Admitting: Obstetrics and Gynecology

## 2019-09-18 ENCOUNTER — Telehealth: Payer: Self-pay

## 2019-09-18 ENCOUNTER — Other Ambulatory Visit: Payer: Self-pay

## 2019-09-18 ENCOUNTER — Encounter: Payer: Self-pay | Admitting: Obstetrics and Gynecology

## 2019-09-18 ENCOUNTER — Telehealth: Payer: Self-pay | Admitting: *Deleted

## 2019-09-18 VITALS — BP 116/64 | HR 110 | Temp 98.4°F | Ht 62.0 in | Wt 150.0 lb

## 2019-09-18 DIAGNOSIS — B349 Viral infection, unspecified: Secondary | ICD-10-CM | POA: Diagnosis not present

## 2019-09-18 NOTE — Patient Instructions (Signed)
Viral Illness, Adult Viruses are tiny germs that can get into a person's body and cause illness. There are many different types of viruses, and they cause many types of illness. Viral illnesses can range from mild to severe. They can affect various parts of the body. Common illnesses that are caused by a virus include colds and the flu. Viral illnesses also include serious conditions such as HIV/AIDS (human immunodeficiency virus/acquired immunodeficiency syndrome). A few viruses have been linked to certain cancers. What are the causes? Many types of viruses can cause illness. Viruses invade cells in your body, multiply, and cause the infected cells to malfunction or die. When the cell dies, it releases more of the virus. When this happens, you develop symptoms of the illness, and the virus continues to spread to other cells. If the virus takes over the function of the cell, it can cause the cell to divide and grow out of control, as is the case when a virus causes cancer. Different viruses get into the body in different ways. You can get a virus by:  Swallowing food or water that is contaminated with the virus.  Breathing in droplets that have been coughed or sneezed into the air by an infected person.  Touching a surface that has been contaminated with the virus and then touching your eyes, nose, or mouth.  Being bitten by an insect or animal that carries the virus.  Having sexual contact with a person who is infected with the virus.  Being exposed to blood or fluids that contain the virus, either through an open cut or during a transfusion. If a virus enters your body, your body's defense system (immune system) will try to fight the virus. You may be at higher risk for a viral illness if your immune system is weak. What are the signs or symptoms? Symptoms vary depending on the type of virus and the location of the cells that it invades. Common symptoms of the main types of viral illnesses  include: Cold and flu viruses  Fever.  Headache.  Sore throat.  Muscle aches.  Nasal congestion.  Cough. Digestive system (gastrointestinal) viruses  Fever.  Abdominal pain.  Nausea.  Diarrhea. Liver viruses (hepatitis)  Loss of appetite.  Tiredness.  Yellowing of the skin (jaundice). Brain and spinal cord viruses  Fever.  Headache.  Stiff neck.  Nausea and vomiting.  Confusion or sleepiness. Skin viruses  Warts.  Itching.  Rash. Sexually transmitted viruses  Discharge.  Swelling.  Redness.  Rash. How is this treated? Viruses can be difficult to treat because they live within cells. Antibiotic medicines do not treat viruses because these drugs do not get inside cells. Treatment for a viral illness may include:  Resting and drinking plenty of fluids.  Medicines to relieve symptoms. These can include over-the-counter medicine for pain and fever, medicines for cough or congestion, and medicines to relieve diarrhea.  Antiviral medicines. These drugs are available only for certain types of viruses. They may help reduce flu symptoms if taken early. There are also many antiviral medicines for hepatitis and HIV/AIDS. Some viral illnesses can be prevented with vaccinations. A common example is the flu shot. Follow these instructions at home: Medicines   Take over-the-counter and prescription medicines only as told by your health care provider.  If you were prescribed an antiviral medicine, take it as told by your health care provider. Do not stop taking the medicine even if you start to feel better.  Be aware of when   antibiotics are needed and when they are not needed. Antibiotics do not treat viruses. If your health care provider thinks that you may have a bacterial infection as well as a viral infection, you may get an antibiotic. ? Do not ask for an antibiotic prescription if you have been diagnosed with a viral illness. That will not make your  illness go away faster. ? Frequently taking antibiotics when they are not needed can lead to antibiotic resistance. When this develops, the medicine no longer works against the bacteria that it normally fights. General instructions  Drink enough fluids to keep your urine clear or pale yellow.  Rest as much as possible.  Return to your normal activities as told by your health care provider. Ask your health care provider what activities are safe for you.  Keep all follow-up visits as told by your health care provider. This is important. How is this prevented? Take these actions to reduce your risk of viral infection:  Eat a healthy diet and get enough rest.  Wash your hands often with soap and water. This is especially important when you are in public places. If soap and water are not available, use hand sanitizer.  Avoid close contact with friends and family who have a viral illness.  If you travel to areas where viral gastrointestinal infection is common, avoid drinking water or eating raw food.  Keep your immunizations up to date. Get a flu shot every year as told by your health care provider.  Do not share toothbrushes, nail clippers, razors, or needles with other people.  Always practice safe sex.  Contact a health care provider if:  You have symptoms of a viral illness that do not go away.  Your symptoms come back after going away.  Your symptoms get worse. Get help right away if:  You have trouble breathing.  You have a severe headache or a stiff neck.  You have severe vomiting or abdominal pain. This information is not intended to replace advice given to you by your health care provider. Make sure you discuss any questions you have with your health care provider. Document Revised: 06/04/2017 Document Reviewed: 11/01/2015 Elsevier Patient Education  2020 Else[employer] Inc.  

## 2019-09-18 NOTE — Telephone Encounter (Signed)
Spoke with [facility]. Appt scheduled for 09/20/19 at 11:30am. For rash on bilateral upper extremities, nexplanon placed 09/06/19. Neg Covid19 test in ER on 09/14/19.  [facility] [place]: Alysia Penna, NP [address] [place], [place] [address] Phone: [phone]   Call to patient, advised as seen above. Patient is driving, RN will return call to patient and leave details on voicemail. Advised patient if time/date does not work, she can contact PCP directly to reschedule. Patient notified of info on viral illness added to OV notes, can review in MyChart. Patient verbalizes understanding and is agreeable.   Routing to provider for final review. Patient is agreeable to disposition. Will close encounter.

## 2019-09-18 NOTE — Telephone Encounter (Signed)
Spoke with patient. Patient had nexplanon inserted in office with Dr.Jertson on 09/06/2019. Patient states that on 09/13/2019 she began to have a rash on her arms and legs. Rash was erythematous and itchy. Denies use of any new soaps, lotions, detergents, foods, or fragrance products. Reports that she had a fever of 101 F and chills over the weekend.  Patient was seen with her PCP at [facility] on 09/14/2019 and was given a steroid injection. Symptoms worsened and she was instructed to go to the ER. COVID screening negative. Patient had a COVID test in ER on 09/14/2019 which was negative. Patient states that she has not had exposure or contact with anyone with COVID or COVID symptoms. Denies any fever at this time. Reports still having itching on her arms but no current rash. Patient was advised to follow up with our office as the only change she has had recently is her IUD removal and nexplanon insertion. Appointment scheduled for today at 3 pm with Dr.Jertson.  Routing to provider and will close encounter.

## 2019-09-18 NOTE — Telephone Encounter (Signed)
Spoke with patient. Patient states that she was tested for all viral infections over the weekend and all testing returned negative. Advised patient Dr.Jertson feels she may need allergy testing as it is not likely this is coming from her Nexplanon. Patient is open to additional testing if needed, but wants to check with Dr.Jertson that referral to allergy testing is the best option to get answers. Wants to be evaluated and figure out what is going on as soon as possible and feels she is not getting answers.

## 2019-09-18 NOTE — Progress Notes (Signed)
GYNECOLOGY  VISIT   HPI: 29 y.o.   Divorced Black or [place] American Not Hispanic or Latino  female   [phone] with Patient's last menstrual period was 08/28/2019 (approximate).   here for rash on arms and legs that started on 09/13/19. She was seen in the ER on 09/14/19 c/o a rash, fever, chills and syncope. She had a normal CBC, CMP, negative UPT, negative RPR, negative covid test. She had negative HIV testing last month.    She had a nexplanon inserted on 09/06/19 and she is wondering if her symptoms are from the Nexplanon. Her rash is currently gone, comes back if she gets hot.    GYNECOLOGIC HISTORY: Patient's last menstrual period was 08/28/2019 (approximate). Contraception:Nexplanon  Menopausal hormone therapy: none        OB History    Gravida  3   Para  2   Term  2   Preterm      AB  1   Living  2     SAB  1   TAB      Ectopic      Multiple      Live Births                 There are no problems to display for this patient.   Past Medical History:  Diagnosis Date  . Abnormal Pap smear of cervix   . Anemia   . Ovarian cyst   . STD (sexually transmitted disease)    HPV    Past Surgical History:  Procedure Laterality Date  . CESAREAN SECTION    . INTRAUTERINE DEVICE (IUD) INSERTION     mirena iud inserted around 7/19    Current Outpatient Medications  Medication Sig Dispense Refill  . ibuprofen (ADVIL) 800 MG tablet Take 1 tablet (800 mg total) by mouth every 8 (eight) hours as needed. 30 tablet 0  . Multiple Vitamins-Minerals (MULTIVITAMIN WITH MINERALS) tablet Take 1 tablet by mouth daily.    . predniSONE (STERAPRED UNI-PAK 21 TAB) 10 MG (21) TBPK tablet Take by mouth daily. Take 6 tabs by mouth daily  for 2 days, then 5 tabs for 2 days, then 4 tabs for 2 days, then 3 tabs for 2 days, 2 tabs for 2 days, then 1 tab by mouth daily for 2 days 42 tablet 0  . diphenhydrAMINE (BENADRYL) 25 MG tablet Take 25 mg by mouth as needed for allergies.     No  current facility-administered medications for this visit.     ALLERGIES: Patient has no known allergies.  Family History  Problem Relation Age of Onset  . Diabetes Mother   . Diabetes Father   . Diabetes Sister     Social History   Socioeconomic History  . Marital status: Divorced    Spouse name: Not on file  . Number of children: Not on file  . Years of education: Not on file  . Highest education level: Not on file  Occupational History  . Not on file  Tobacco Use  . Smoking status: Never Smoker  . Smokeless tobacco: Never Used  Substance and Sexual Activity  . Alcohol use: Yes    Comment: occassionally  . Drug use: No  . Sexual activity: Yes    Partners: Male    Birth control/protection: I.U.D.  Other Topics Concern  . Not on file  Social History Narrative  . Not on file   Social Determinants of Health   Financial Resource Strain:   .  Difficulty of Paying Living Expenses:   Food Insecurity:   . Worried About Charity fundraiser in the Last Year:   . Arboriculturist in the Last Year:   Transportation Needs:   . Film/video editor (Medical):   Marland Kitchen Lack of Transportation (Non-Medical):   Physical Activity:   . Days of Exercise per Week:   . Minutes of Exercise per Session:   Stress:   . Feeling of Stress :   Social Connections:   . Frequency of Communication with Friends and Family:   . Frequency of Social Gatherings with Friends and Family:   . Attends Religious Services:   . Active Member of Clubs or Organizations:   . Attends Archivist Meetings:   Marland Kitchen Marital Status:   Intimate Partner Violence:   . Fear of Current or Ex-Partner:   . Emotionally Abused:   Marland Kitchen Physically Abused:   . Sexually Abused:     Review of Systems  Constitutional: Positive for chills.  Skin: Positive for rash.    PHYSICAL EXAMINATION:    BP 116/64   Pulse (!) 110   Temp 98.4 F (36.9 C)   Ht 5\' 2"  (1.575 m)   Wt 150 lb (68 kg)   LMP 08/28/2019  (Approximate)   SpO2 97%   BMI 27.44 kg/m     General appearance: alert, cooperative and appears stated age Arm: no current rash. No erythema surrounding her nexplanon site, nexplanon palpated in place.    ASSESSMENT Viral syndrome.    PLAN Hydrate, rest, take tylenol Recommend f/u with primary care MD, appointment made for her.    An After Visit Summary was printed and given to the patient.

## 2019-09-19 ENCOUNTER — Emergency Department (HOSPITAL_COMMUNITY)
Admission: EM | Admit: 2019-09-19 | Discharge: 2019-09-20 | Disposition: A | Discharge status: Home / Self Care | Payer: Managed Care, Other (non HMO) | Attending: Emergency Medicine | Admitting: Emergency Medicine

## 2019-09-19 ENCOUNTER — Other Ambulatory Visit: Payer: Self-pay

## 2019-09-19 ENCOUNTER — Encounter (HOSPITAL_COMMUNITY): Payer: Self-pay | Admitting: Emergency Medicine

## 2019-09-19 DIAGNOSIS — Z5321 Procedure and treatment not carried out due to patient leaving prior to being seen by health care provider: Secondary | ICD-10-CM | POA: Insufficient documentation

## 2019-09-19 DIAGNOSIS — R21 Rash and other nonspecific skin eruption: Secondary | ICD-10-CM | POA: Diagnosis present

## 2019-09-19 NOTE — ED Triage Notes (Signed)
Pt c/o rash for a while was seen here for the same with no improvement.

## 2019-09-20 ENCOUNTER — Inpatient Hospital Stay: Payer: Managed Care, Other (non HMO) | Admitting: Nurse Practitioner

## 2019-09-20 NOTE — ED Notes (Signed)
Patient has been called 5x , no response. Will be moved OTF

## 2019-09-26 ENCOUNTER — Inpatient Hospital Stay: Payer: Managed Care, Other (non HMO) | Admitting: Nurse Practitioner

## 2019-10-09 NOTE — Addendum Note (Signed)
Addended by: Isabell Jarvis on: 10/09/2019 09:17 AM   Modules accepted: Orders

## 2019-10-16 ENCOUNTER — Telehealth: Payer: Self-pay | Admitting: Obstetrics and Gynecology

## 2019-10-16 NOTE — Telephone Encounter (Signed)
Patient has not stopped bleeding since having Nexplanon inserted.

## 2019-10-16 NOTE — Telephone Encounter (Signed)
Spoke with pt. Pt has h/o HPV, Mirena IUD removed 09/01/19 due having ovarian cyst develop. Nexplanon inserted 09/06/19 by Dr Oscar La. H/o anemia. Unknown LMP due to bleeding. Last AEX 09/01/2019.  Spoke with pt. Pt reports having heavy flow x 1 month since insertion of Nexplanon. Pt states changing pad and tampon every 1-2 hours and has developed dizziness and lightheadedness for past 2 days. Pt also states having cramping and taking Ibuprofen as needed. Advised pt will discuss with Dr Oscar La and let know  plan of care.   After speaking with Dr Oscar La, pt given recommendations to be seen at ER due to symptoms and bleeding. Pt agreeable and verbalized understanding.   Routing to Dr Oscar La for review.  Encounter closed.

## 2019-10-16 NOTE — Telephone Encounter (Signed)
Left message for pt to return call to triage RN. 

## 2019-10-17 ENCOUNTER — Ambulatory Visit (INDEPENDENT_AMBULATORY_CARE_PROVIDER_SITE_OTHER): Payer: Managed Care, Other (non HMO) | Admitting: Obstetrics and Gynecology

## 2019-10-17 ENCOUNTER — Other Ambulatory Visit: Payer: Self-pay

## 2019-10-17 ENCOUNTER — Telehealth: Payer: Self-pay | Admitting: Obstetrics and Gynecology

## 2019-10-17 ENCOUNTER — Encounter: Payer: Self-pay | Admitting: Obstetrics and Gynecology

## 2019-10-17 NOTE — Telephone Encounter (Signed)
This patient was an add in today. She left prior to being seen. Her pulse was elevated, still c/o feeling lightheaded. She still should be seen and have lab work. If her bleeding picks up, or if she has significant sensation of being lightheaded, she needs to go to urgent care or the ER. Otherwise she should be seen here.  Unfortunately being an add in patient, she may need to wait more than normal. She is being added to a full schedule.

## 2019-10-17 NOTE — Telephone Encounter (Signed)
Spoke with pt. Pt give recommendations per Dr Oscar La since didn't go to ER yesterday.  Pt states had "gush" last night x 1 that filled a pad, but nothing so far this morning. Pt had some dizziness, but has taken Ibuprofen this am and is better now. Pt agreeable to come for OV. OV scheduled as work-in appt today  at 1:30pm. Pt verbalized understanding. CPS negative.   Routing to Dr Oscar La for review.  Encounter closed.

## 2019-10-17 NOTE — Progress Notes (Signed)
The patient left prior to being seen.   GYNECOLOGY  VISIT   HPI: 29 y.o.   Divorced Black or [place] American Not Hispanic or Latino  female   [phone] with No LMP recorded.   here for Vaginal bleeding with Nexplanon.She says that she has been bleeding since the insertion of her Nexplanon. Patient states that her bleeding has stopped as of right now. She had a gush of blood after her shower but bleeding stoped over night. She is still light headed.   GYNECOLOGIC HISTORY: No LMP recorded. Contraception:nexplanon  Menopausal hormone therapy: none         OB History    Gravida  3   Para  2   Term  2   Preterm      AB  1   Living  2     SAB  1   TAB      Ectopic      Multiple      Live Births                 There are no problems to display for this patient.   Past Medical History:  Diagnosis Date  . Abnormal Pap smear of cervix   . Anemia   . Ovarian cyst   . STD (sexually transmitted disease)    HPV    Past Surgical History:  Procedure Laterality Date  . CESAREAN SECTION    . INTRAUTERINE DEVICE (IUD) INSERTION     mirena iud inserted around 7/19    Current Outpatient Medications  Medication Sig Dispense Refill  . ibuprofen (ADVIL) 800 MG tablet Take 1 tablet (800 mg total) by mouth every 8 (eight) hours as needed. 30 tablet 0  . Multiple Vitamins-Minerals (MULTIVITAMIN WITH MINERALS) tablet Take 1 tablet by mouth daily.     No current facility-administered medications for this visit.     ALLERGIES: Patient has no known allergies.  Family History  Problem Relation Age of Onset  . Diabetes Mother   . Diabetes Father   . Diabetes Sister     Social History   Socioeconomic History  . Marital status: Divorced    Spouse name: Not on file  . Number of children: Not on file  . Years of education: Not on file  . Highest education level: Not on file  Occupational History  . Not on file  Tobacco Use  . Smoking status: Never Smoker  .  Smokeless tobacco: Never Used  Substance and Sexual Activity  . Alcohol use: Yes    Comment: occassionally  . Drug use: No  . Sexual activity: Yes    Partners: Male    Birth control/protection: I.U.D.  Other Topics Concern  . Not on file  Social History Narrative  . Not on file   Social Determinants of Health   Financial Resource Strain:   . Difficulty of Paying Living Expenses:   Food Insecurity:   . Worried About Programme researcher, broadcasting/film/video in the Last Year:   . Barista in the Last Year:   Transportation Needs:   . Freight forwarder (Medical):   Marland Kitchen Lack of Transportation (Non-Medical):   Physical Activity:   . Days of Exercise per Week:   . Minutes of Exercise per Session:   Stress:   . Feeling of Stress :   Social Connections:   . Frequency of Communication with Friends and Family:   . Frequency of Social Gatherings with Friends and Family:   .  Attends Religious Services:   . Active Member of Clubs or Organizations:   . Attends Archivist Meetings:   Marland Kitchen Marital Status:   Intimate Partner Violence:   . Fear of Current or Ex-Partner:   . Emotionally Abused:   Marland Kitchen Physically Abused:   . Sexually Abused:     ROS  PHYSICAL EXAMINATION:    BP (!) 100/58   Pulse (!) 106   Temp 98.1 F (36.7 C)   Ht 5\' 2"  (1.575 m)   Wt 148 lb (67.1 kg)   SpO2 100%   BMI 27.07 kg/m     General appearance: alert, cooperative and appears stated age

## 2019-10-17 NOTE — Telephone Encounter (Signed)
It doesn't appear that she went to the ER. Please call and check on her. If she wasn't seen, please put her on my schedule today.

## 2019-10-17 NOTE — Telephone Encounter (Signed)
Left message for pt to return call to triage RN. 

## 2019-10-19 NOTE — Telephone Encounter (Signed)
Pt called today and spoke to front office staff  and rescheduled appt on 10/26/19 with Dr Oscar La.   Routing to Dr Oscar La for review.  Encounter closed.

## 2019-10-25 ENCOUNTER — Telehealth: Payer: Self-pay | Admitting: Obstetrics and Gynecology

## 2019-10-25 NOTE — Telephone Encounter (Signed)
Patient cancelled nexplanon bleeding check. Says she is feeling better.

## 2019-10-26 ENCOUNTER — Ambulatory Visit: Payer: Managed Care, Other (non HMO) | Admitting: Obstetrics and Gynecology

## 2019-10-30 ENCOUNTER — Ambulatory Visit: Payer: Managed Care, Other (non HMO)

## 2019-10-30 NOTE — Progress Notes (Deleted)
Patient in today for 2nd Gardasil injection.   Contraception: Nexplanon LMP: *** Last AEX: 09/01/19 with JJ  Injection given in RD. Patient tolerated shot well.   Patient informed next injection due in about 4 months.  Advised patient, if not on birth control, to return for next injection with cycle.   Routed to provider for final review.  Encounter closed.

## 2019-11-07 ENCOUNTER — Ambulatory Visit (INDEPENDENT_AMBULATORY_CARE_PROVIDER_SITE_OTHER): Payer: Managed Care, Other (non HMO) | Admitting: *Deleted

## 2019-11-07 ENCOUNTER — Telehealth: Payer: Self-pay

## 2019-11-07 ENCOUNTER — Other Ambulatory Visit: Payer: Self-pay

## 2019-11-07 VITALS — BP 120/62 | HR 92 | Temp 97.9°F | Resp 121 | Ht 62.0 in | Wt 153.1 lb

## 2019-11-07 DIAGNOSIS — Z23 Encounter for immunization: Secondary | ICD-10-CM | POA: Diagnosis not present

## 2019-11-07 NOTE — Progress Notes (Signed)
Patient in today for second Gardasil injection.   Contraception: Nexplanon  LMP: Nexplanon Last AEX: 09-01-19 with Dr. Oscar La   Injection given in right deltoid. Patient tolerated shot well.   Patient informed next injection due in about four months. (after 03-09-20)  Advised patient, if not on birth control, to return for next injection with cycle.   Routed to provider for final review.  Encounter closed.

## 2019-11-07 NOTE — Telephone Encounter (Signed)
Opened in error

## 2019-11-08 ENCOUNTER — Ambulatory Visit: Payer: Managed Care, Other (non HMO)

## 2020-01-04 ENCOUNTER — Telehealth: Payer: Self-pay

## 2020-01-04 NOTE — Telephone Encounter (Signed)
Patient left message regarding irregular bleeding with large clots, lower back pain, and pain with sex.

## 2020-01-04 NOTE — Telephone Encounter (Signed)
Left message to call Rusell Meneely, RN at [facility] [phone].   

## 2020-01-15 NOTE — Telephone Encounter (Signed)
Left message to call Karlon Schlafer, RN at [facility] [phone].   

## 2020-01-17 NOTE — Telephone Encounter (Signed)
No return call from patient x2.   Routing to Dr. Shirley Friar.   Encounter closed.

## 2020-03-12 NOTE — Progress Notes (Signed)
GYNECOLOGY  VISIT   HPI: 29 y.o.   Divorced  [place]  female   [phone] with No LMP recorded. Patient has had an implant.   here for   3rd gardasil and left breast pain  Breast pain started a couple of weeks ago.  Started with left breast and then became both breasts.  Pain is every other day and feels like a pull or squeeze toward the nipple area on both breasts.  No known lump.  She is worried.   Taking Ibuprofen for her back, and it does not help her breast pain.   Works at [employer], which is not a change.   No recent trauma.   No prior breast evaluation.   Mother had breast cancer in her 30s, and had lumpectomy.  Patient does not have details about her care, and just learned this information.   Has Nexplanon and has some irregular bleeding.  She is still having pelvic pain.  She declines future childbearing.   GYNECOLOGIC HISTORY: No LMP recorded. Patient has had an implant. Contraception:  nexplanon Menopausal hormone therapy:  none Last mammogram:  none Last pap smear:   03-02-18 negative         OB History    Gravida  3   Para  2   Term  2   Preterm      AB  1   Living  2     SAB  1   TAB      Ectopic      Multiple      Live Births                 There are no problems to display for this patient.   Past Medical History:  Diagnosis Date  . Abnormal Pap smear of cervix   . Anemia   . Ovarian cyst   . STD (sexually transmitted disease)    HPV    Past Surgical History:  Procedure Laterality Date  . CESAREAN SECTION    . INTRAUTERINE DEVICE (IUD) INSERTION     mirena iud inserted around 7/19    Current Outpatient Medications  Medication Sig Dispense Refill  . Etonogestrel (NEXPLANON [place]) Inject into the skin.    Marland Kitchen ibuprofen (ADVIL) 800 MG tablet Take 1 tablet (800 mg total) by mouth every 8 (eight) hours as needed. 30 tablet 0  . Multiple Vitamins-Minerals (MULTIVITAMIN WITH MINERALS) tablet Take 1 tablet by mouth daily.      No current facility-administered medications for this visit.     ALLERGIES: Patient has no known allergies.  Family History  Problem Relation Age of Onset  . Diabetes Mother   . Breast cancer Mother        64's  . Diabetes Father   . Diabetes Sister     Social History   Socioeconomic History  . Marital status: Divorced    Spouse name: Not on file  . Number of children: Not on file  . Years of education: Not on file  . Highest education level: Not on file  Occupational History  . Not on file  Tobacco Use  . Smoking status: Never Smoker  . Smokeless tobacco: Never Used  Substance and Sexual Activity  . Alcohol use: Yes    Comment: occassionally  . Drug use: No  . Sexual activity: Yes    Partners: Male    Birth control/protection: Implant  Other Topics Concern  . Not on file  Social History Narrative  .  Not on file   Social Determinants of Health   Financial Resource Strain:   . Difficulty of Paying Living Expenses: Not on file  Food Insecurity:   . Worried About Programme researcher, broadcasting/film/video in the Last Year: Not on file  . Ran Out of Food in the Last Year: Not on file  Transportation Needs:   . Lack of Transportation (Medical): Not on file  . Lack of Transportation (Non-Medical): Not on file  Physical Activity:   . Days of Exercise per Week: Not on file  . Minutes of Exercise per Session: Not on file  Stress:   . Feeling of Stress : Not on file  Social Connections:   . Frequency of Communication with Friends and Family: Not on file  . Frequency of Social Gatherings with Friends and Family: Not on file  . Attends Religious Services: Not on file  . Active Member of Clubs or Organizations: Not on file  . Attends Banker Meetings: Not on file  . Marital Status: Not on file  Intimate Partner Violence:   . Fear of Current or Ex-Partner: Not on file  . Emotionally Abused: Not on file  . Physically Abused: Not on file  . Sexually Abused: Not on file     Review of Systems  Genitourinary: Positive for menstrual problem.       Breast pain--left  All other systems reviewed and are negative.   PHYSICAL EXAMINATION:    BP 106/76 (BP Location: Right Arm, Patient Position: Sitting, Cuff Size: Normal)   Pulse 80   Resp 14   Ht 5\' 2"  (1.575 m)   Wt 163 lb 4 oz (74 kg)   BMI 29.86 kg/m     General appearance: alert, cooperative and appears stated age   Breasts: normal appearance, no masses or tenderness, No nipple retraction or dimpling, No nipple discharge or bleeding, No axillary or supraclavicular adenopathy   Lymph nodes: Cervical, supraclavicular, and axillary nodes normal.  Left arm:  Nexplanon present.   Chaperone was present for exam:  , RN  ASSESSMENT  Bilateral breast pain.  Family history of premenopausal breast cancer.  Gardasil vaccination series.  Ongoing pelvic pain.   PLAN  Check UPT now:  Negative. We discussed breast pain and treatment with NSAIDS, new bra, ice or heat.  Dx bilateral mammogram.  Will refer for genetics evaluation and testing.  She will try to get additional family history prior to this.  Follow up with Dr. Arnold Long.  May want to wait until after genetic testing is back.  Ok for Gardasil today.

## 2020-03-13 ENCOUNTER — Encounter: Payer: Self-pay | Admitting: Obstetrics and Gynecology

## 2020-03-13 ENCOUNTER — Ambulatory Visit (INDEPENDENT_AMBULATORY_CARE_PROVIDER_SITE_OTHER): Payer: Managed Care, Other (non HMO) | Admitting: Obstetrics and Gynecology

## 2020-03-13 ENCOUNTER — Other Ambulatory Visit: Payer: Self-pay

## 2020-03-13 ENCOUNTER — Telehealth: Payer: Self-pay | Admitting: Obstetrics and Gynecology

## 2020-03-13 VITALS — BP 106/76 | HR 80 | Resp 14 | Ht 62.0 in | Wt 163.2 lb

## 2020-03-13 DIAGNOSIS — Z803 Family history of malignant neoplasm of breast: Secondary | ICD-10-CM

## 2020-03-13 DIAGNOSIS — N644 Mastodynia: Secondary | ICD-10-CM

## 2020-03-13 DIAGNOSIS — Z23 Encounter for immunization: Secondary | ICD-10-CM

## 2020-03-13 LAB — POCT URINE PREGNANCY: Preg Test, Ur: NEGATIVE

## 2020-03-13 NOTE — Telephone Encounter (Signed)
Spoke with Angelique Blonder at [facility].  Patient scheduled for bilateral Dx MMG and L/R breast US, if needed, on 03/27/20 at 8:40am, arrive at 8:20am  Placed in MMG hold.   Call placed to patient, patient is driving, she request information be sent as MyChart message. Patient is agreeable to date and time.   MyChart message to patient.   Encounter closed.

## 2020-03-13 NOTE — Telephone Encounter (Signed)
Please schedule dx bilateral mammogram at [facility].   She has bilateral breast pain and a normal exam. She has just learned that her mother had breast cancer in her 30s.  No other details are known.   I am also making a referral for genetics counseling and testing.

## 2020-03-14 ENCOUNTER — Telehealth: Payer: Self-pay | Admitting: Genetic Counselor

## 2020-03-14 NOTE — Telephone Encounter (Signed)
Received a genetic counseling referral from Dr. Edward Jolly for fhx of breast cancer. Pt has been cld and scheduled to see Clydie Braun on 10/4 at 10am. Pt aware to arrive 15 minutes early.

## 2020-03-27 ENCOUNTER — Ambulatory Visit: Payer: Managed Care, Other (non HMO)

## 2020-03-27 ENCOUNTER — Other Ambulatory Visit: Payer: Self-pay

## 2020-03-27 ENCOUNTER — Ambulatory Visit
Admission: RE | Admit: 2020-03-27 | Discharge: 2020-03-27 | Disposition: A | Discharge status: Home / Self Care | Payer: Managed Care, Other (non HMO) | Source: Ambulatory Visit | Attending: Obstetrics and Gynecology | Admitting: Obstetrics and Gynecology

## 2020-03-27 ENCOUNTER — Telehealth: Payer: Self-pay | Admitting: Obstetrics and Gynecology

## 2020-03-27 DIAGNOSIS — Z3046 Encounter for surveillance of implantable subdermal contraceptive: Secondary | ICD-10-CM

## 2020-03-27 DIAGNOSIS — N644 Mastodynia: Secondary | ICD-10-CM

## 2020-03-27 DIAGNOSIS — Z803 Family history of malignant neoplasm of breast: Secondary | ICD-10-CM

## 2020-03-27 NOTE — Telephone Encounter (Signed)
AEX 08/2019 with JJ FH of Breast CA Nexplanon insertion 09/06/2019  Spoke with pt. Pt states having irregular bleeding monthly, mood changes and weight gain since having Nexplanon insertion. Pt states having dizzy spells intermittently as well. Today, pt states feeling good and denies any heavy bleeding, clots, fever, chills or any abd pain/cramps.  Pt states would like to discuss having a BTL for contraception.  Pt advised to have OV and discuss with Dr Edward Jolly. Pt agreeable. Pt scheduled for Nexplanon removal on 04/01/20 at 3:30pm. Pt verbalized understanding to date and time of appt.  Cc: Hedda Slade for precert Orders placed.  Encounter closed

## 2020-03-27 NOTE — Telephone Encounter (Signed)
Patient calling to schedule nexplanon removal.

## 2020-03-28 ENCOUNTER — Telehealth: Payer: Self-pay | Admitting: Obstetrics and Gynecology

## 2020-03-28 NOTE — Telephone Encounter (Signed)
Call placed to convey benefits for Nexplanon removal.

## 2020-04-01 ENCOUNTER — Encounter: Payer: Self-pay | Admitting: Obstetrics and Gynecology

## 2020-04-01 ENCOUNTER — Ambulatory Visit (INDEPENDENT_AMBULATORY_CARE_PROVIDER_SITE_OTHER): Payer: Managed Care, Other (non HMO) | Admitting: Obstetrics and Gynecology

## 2020-04-01 ENCOUNTER — Other Ambulatory Visit: Payer: Self-pay

## 2020-04-01 VITALS — BP 118/80 | HR 78 | Ht 62.0 in | Wt 167.0 lb

## 2020-04-01 DIAGNOSIS — Z3009 Encounter for other general counseling and advice on contraception: Secondary | ICD-10-CM

## 2020-04-01 DIAGNOSIS — Z3046 Encounter for surveillance of implantable subdermal contraceptive: Secondary | ICD-10-CM

## 2020-04-01 NOTE — H&P (View-Only) (Signed)
GYNECOLOGY  VISIT   HPI: 29 y.o.   Divorced Black or [place] American Not Hispanic or Latino  female   [phone] with Patient's last menstrual period was 03/24/2020.   here for contraception counseling and nexplanon removal. She had a a mirena IUD removed in 2/21, it was partial expulsed. It was originally placed in 9/19. She then had a nexplanon placed in 3/21. She has had abnormal bleeding, breast pain and weight gain. She was seen earlier this month with breast pain and had negative imaging. Mom with breast cancer in 30's.  Same partner for almost 2 years.  She is sure she doesn't want more kids, interested in sterilization.       GYNECOLOGIC HISTORY: Patient's last menstrual period was 03/24/2020. Contraception:Nexplanon Menopausal hormone therapy: none        OB History    Gravida  3   Para  2   Term  2   Preterm      AB  1   Living  2     SAB  1   TAB      Ectopic      Multiple      Live Births                 Patient Active Problem List   Diagnosis Date Noted  . Family history of breast cancer 03/13/2020    Past Medical History:  Diagnosis Date  . Abnormal Pap smear of cervix   . Anemia   . Ovarian cyst   . STD (sexually transmitted disease)    HPV    Past Surgical History:  Procedure Laterality Date  . CESAREAN SECTION    . INTRAUTERINE DEVICE (IUD) INSERTION     mirena iud inserted around 7/19    Current Outpatient Medications  Medication Sig Dispense Refill  . Etonogestrel (NEXPLANON [place]) Inject into the skin.    Marland Kitchen ibuprofen (ADVIL) 800 MG tablet Take 1 tablet (800 mg total) by mouth every 8 (eight) hours as needed. 30 tablet 0  . Multiple Vitamins-Minerals (MULTIVITAMIN WITH MINERALS) tablet Take 1 tablet by mouth daily.     No current facility-administered medications for this visit.     ALLERGIES: Patient has no known allergies.  Family History  Problem Relation Age of Onset  . Diabetes Mother   . Breast cancer Mother         94's  . Diabetes Father   . Diabetes Sister     Social History   Socioeconomic History  . Marital status: Divorced    Spouse name: Not on file  . Number of children: Not on file  . Years of education: Not on file  . Highest education level: Not on file  Occupational History  . Not on file  Tobacco Use  . Smoking status: Never Smoker  . Smokeless tobacco: Never Used  Substance and Sexual Activity  . Alcohol use: Yes    Comment: occassionally  . Drug use: No  . Sexual activity: Yes    Partners: Male    Birth control/protection: Implant  Other Topics Concern  . Not on file  Social History Narrative  . Not on file   Social Determinants of Health   Financial Resource Strain:   . Difficulty of Paying Living Expenses: Not on file  Food Insecurity:   . Worried About Programme researcher, broadcasting/film/video in the Last Year: Not on file  . Ran Out of Food in the Last Year: Not on file  Transportation Needs:   . Lack of Transportation (Medical): Not on file  . Lack of Transportation (Non-Medical): Not on file  Physical Activity:   . Days of Exercise per Week: Not on file  . Minutes of Exercise per Session: Not on file  Stress:   . Feeling of Stress : Not on file  Social Connections:   . Frequency of Communication with Friends and Family: Not on file  . Frequency of Social Gatherings with Friends and Family: Not on file  . Attends Religious Services: Not on file  . Active Member of Clubs or Organizations: Not on file  . Attends Club or Organization Meetings: Not on file  . Marital Status: Not on file  Intimate Partner Violence:   . Fear of Current or Ex-Partner: Not on file  . Emotionally Abused: Not on file  . Physically Abused: Not on file  . Sexually Abused: Not on file    Review of Systems  Constitutional: Negative.   HENT: Negative.   Eyes: Negative.   Respiratory: Negative.   Cardiovascular: Negative.   Gastrointestinal: Negative.   Genitourinary: Negative.   Musculoskeletal:  Negative.   Skin: Negative.   Neurological: Negative.   Endo/Heme/Allergies: Negative.   Psychiatric/Behavioral: Negative.     PHYSICAL EXAMINATION:    BP 118/80 (BP Location: Right Arm, Patient Position: Sitting, Cuff Size: Normal)   Pulse 78   Ht 5' 2" (1.575 m)   Wt 167 lb (75.8 kg)   LMP 03/24/2020   SpO2 100%   BMI 30.54 kg/m     General appearance: alert, cooperative and appears stated age  Risks of nexplanon removal and reinsertion were reviewed with the patient and a consent was signed.  The patient was placed in the supine position with her left arm bent at the elbow. The area was cleansed with betadine and injected with 1% lidocaine. A #11 blade was used to incise over the distal end of the nexplanon implant. The implant was grasped with a clamp and removed easily.   The patients arm was cleansed of betadine and a steri strip was placed over the incision. A gauze was wrapped around her arm.  She tolerated the procedure well.  Instructions for care were discussed.     ASSESSMENT Contraception counseling. She doesn't like the nexplanon, expulsed a mirena, didn't remember pills. Doesn't want any more children.     PLAN Nexplanon removed. Instructions reviewed. Use condoms for contraception.  Discussed her options for contraception, she desires sterilization. Reviewed that sterilization is permanent. She is sure she doesn't want any more children, irregardless of any change in circumstances. Discussed laparoscopic tubal ligation and laparoscopic bilateral salpingectomies. She desires salpingectomies.  Discussed risks of surgery, including: bleeding, infection, damage to near by organs.   Information given   In addition to nexplanon removal, over 20 minutes was spent in total patient care in regards to contraception counseling and management.    

## 2020-04-01 NOTE — Patient Instructions (Signed)
Nexplanon Instructions After Insertion/Removal   Keep bandage clean and dry for 24 hours   May use ice/Tylenol/Ibuprofen for soreness or pain   If you develop fever, drainage or increased warmth from incision site-contact office immediately   Laparoscopic Tubal Ligation Laparoscopic tubal ligation is a procedure to close the fallopian tubes. This is done so that you cannot get pregnant. When the fallopian tubes are closed, the eggs that your ovaries release cannot enter the uterus, and sperm cannot reach the released eggs. You should not have this procedure if you want to get pregnant someday or if you are unsure about having more children. Tell a health care provider about:  Any allergies you have.  All medicines you are taking, including vitamins, herbs, eye drops, creams, and over-the-counter medicines.  Any problems you or family members have had with anesthetic medicines.  Any blood disorders you have.  Any surgeries you have had.  Any medical conditions you have.  Whether you are pregnant or may be pregnant.  Any past pregnancies. What are the risks? Generally, this is a safe procedure. However, problems may occur, including:  Infection.  Bleeding.  Injury to other organs in the abdomen.  Side effects from anesthetic medicines.  Failure of the procedure. This procedure can increase your risk of a kind of pregnancy in which a fertilized egg attaches to the outside of the uterus (ectopic pregnancy). What happens before the procedure? Medicines  Ask your health care provider about: ? Changing or stopping your regular medicines. This is especially important if you are taking diabetes medicines or blood thinners. ? Taking medicines such as aspirin and ibuprofen. These medicines can thin your blood. Do not take these medicines unless your health care provider tells you to take them. ? Taking over-the-counter medicines, vitamins, herbs, and supplements. Staying  hydrated  Follow instructions from your health care provider about hydration, which may include: ? Up to 2 hours before the procedure - you may continue to drink clear liquids, such as water, clear fruit juice, black coffee, and plain tea. Eating and drinking  Follow instructions from your health care provider about eating and drinking, which may include: ? 8 hours before the procedure - stop eating heavy meals or foods, such as meat, fried foods, or fatty foods. ? 6 hours before the procedure - stop eating light meals or foods, such as toast or cereal. ? 6 hours before the procedure - stop drinking milk or drinks that contain milk. ? 2 hours before the procedure - stop drinking clear liquids. General instructions  Do not use any products that contain nicotine or tobacco for at least 4 weeks before the procedure. These products include cigarettes, e-cigarettes, and chewing tobacco. If you need help quitting, ask your health care provider.  Plan to have someone take you home from the hospital.  If you will be going home right after the procedure, plan to have someone with you for 24 hours.  Ask your health care provider: ? How your surgery site will be marked. ? What steps will be taken to help prevent infection. These may include:  Removing hair at the surgery site.  Washing skin with a germ-killing soap.  Taking antibiotic medicine. What happens during the procedure?      An IV will be inserted into one of your veins.  You will be given one or more of the following: ? A medicine to help you relax (sedative). ? A medicine to numb the area (local anesthetic). ?  A medicine to make you fall asleep (general anesthetic). ? A medicine that is injected into an area of your body to numb everything below the injection site (regional anesthetic).  Your bladder may be emptied with a small tube (catheter).  If you have been given a general anesthetic, a tube will be put down your  throat to help you breathe.  Two small incisions will be made in your lower abdomen and near your belly button.  Your abdomen will be inflated with a gas. This will let the surgeon see better and will give the surgeon room to work.  A thin, lighted tube (laparoscope) with a camera attached will be inserted into your abdomen through one of the incisions. Small instruments will be inserted through the other incision.  The fallopian tubes will be tied off, burned (cauterized), or blocked with a clip, ring, or clamp. A small portion in the center of each fallopian tube may be removed.  The gas will be released from the abdomen.  The incisions will be closed with stitches (sutures).  A bandage (dressing) will be placed over the incisions. The procedure may vary among health care providers and hospitals. What happens after the procedure?  Your blood pressure, heart rate, breathing rate, and blood oxygen level will be monitored until you leave the hospital.  You will be given medicine to help with pain, nausea, and vomiting as needed. Summary  Laparoscopic tubal ligation is a procedure that is done so that you cannot get pregnant.  You should not have this procedure if you want to get pregnant someday or if you are unsure about having more children.  The procedure is done using a thin, lighted tube (laparoscope) with a camera attached that will be inserted into your abdomen through an incision.  Follow instructions from your health care provider about eating and drinking before the procedure. This information is not intended to replace advice given to you by your health care provider. Make sure you discuss any questions you have with your health care provider. Document Revised: 11/29/2018 Document Reviewed: 05/17/2018 Elsevier Patient Education  [employer].  Instead of above techniques, would remove the tubes.

## 2020-04-01 NOTE — Progress Notes (Signed)
GYNECOLOGY  VISIT   HPI: 29 y.o.   Divorced Black or [place] American Not Hispanic or Latino  female   [phone] with Patient's last menstrual period was 03/24/2020.   here for contraception counseling and nexplanon removal. She had a a mirena IUD removed in 2/21, it was partial expulsed. It was originally placed in 9/19. She then had a nexplanon placed in 3/21. She has had abnormal bleeding, breast pain and weight gain. She was seen earlier this month with breast pain and had negative imaging. Mom with breast cancer in 30's.  Same partner for almost 2 years.  She is sure she doesn't want more kids, interested in sterilization.       GYNECOLOGIC HISTORY: Patient's last menstrual period was 03/24/2020. Contraception:Nexplanon Menopausal hormone therapy: none        OB History    Gravida  3   Para  2   Term  2   Preterm      AB  1   Living  2     SAB  1   TAB      Ectopic      Multiple      Live Births                 Patient Active Problem List   Diagnosis Date Noted  . Family history of breast cancer 03/13/2020    Past Medical History:  Diagnosis Date  . Abnormal Pap smear of cervix   . Anemia   . Ovarian cyst   . STD (sexually transmitted disease)    HPV    Past Surgical History:  Procedure Laterality Date  . CESAREAN SECTION    . INTRAUTERINE DEVICE (IUD) INSERTION     mirena iud inserted around 7/19    Current Outpatient Medications  Medication Sig Dispense Refill  . Etonogestrel (NEXPLANON [place]) Inject into the skin.    Marland Kitchen ibuprofen (ADVIL) 800 MG tablet Take 1 tablet (800 mg total) by mouth every 8 (eight) hours as needed. 30 tablet 0  . Multiple Vitamins-Minerals (MULTIVITAMIN WITH MINERALS) tablet Take 1 tablet by mouth daily.     No current facility-administered medications for this visit.     ALLERGIES: Patient has no known allergies.  Family History  Problem Relation Age of Onset  . Diabetes Mother   . Breast cancer Mother         94's  . Diabetes Father   . Diabetes Sister     Social History   Socioeconomic History  . Marital status: Divorced    Spouse name: Not on file  . Number of children: Not on file  . Years of education: Not on file  . Highest education level: Not on file  Occupational History  . Not on file  Tobacco Use  . Smoking status: Never Smoker  . Smokeless tobacco: Never Used  Substance and Sexual Activity  . Alcohol use: Yes    Comment: occassionally  . Drug use: No  . Sexual activity: Yes    Partners: Male    Birth control/protection: Implant  Other Topics Concern  . Not on file  Social History Narrative  . Not on file   Social Determinants of Health   Financial Resource Strain:   . Difficulty of Paying Living Expenses: Not on file  Food Insecurity:   . Worried About Programme researcher, broadcasting/film/video in the Last Year: Not on file  . Ran Out of Food in the Last Year: Not on file  Transportation Needs:   . Freight forwarder (Medical): Not on file  . Lack of Transportation (Non-Medical): Not on file  Physical Activity:   . Days of Exercise per Week: Not on file  . Minutes of Exercise per Session: Not on file  Stress:   . Feeling of Stress : Not on file  Social Connections:   . Frequency of Communication with Friends and Family: Not on file  . Frequency of Social Gatherings with Friends and Family: Not on file  . Attends Religious Services: Not on file  . Active Member of Clubs or Organizations: Not on file  . Attends Banker Meetings: Not on file  . Marital Status: Not on file  Intimate Partner Violence:   . Fear of Current or Ex-Partner: Not on file  . Emotionally Abused: Not on file  . Physically Abused: Not on file  . Sexually Abused: Not on file    Review of Systems  Constitutional: Negative.   HENT: Negative.   Eyes: Negative.   Respiratory: Negative.   Cardiovascular: Negative.   Gastrointestinal: Negative.   Genitourinary: Negative.   Musculoskeletal:  Negative.   Skin: Negative.   Neurological: Negative.   Endo/Heme/Allergies: Negative.   Psychiatric/Behavioral: Negative.     PHYSICAL EXAMINATION:    BP 118/80 (BP Location: Right Arm, Patient Position: Sitting, Cuff Size: Normal)   Pulse 78   Ht 5\' 2"  (1.575 m)   Wt 167 lb (75.8 kg)   LMP 03/24/2020   SpO2 100%   BMI 30.54 kg/m     General appearance: alert, cooperative and appears stated age  Risks of nexplanon removal and reinsertion were reviewed with the patient and a consent was signed.  The patient was placed in the supine position with her left arm bent at the elbow. The area was cleansed with betadine and injected with 1% lidocaine. A #11 blade was used to incise over the distal end of the nexplanon implant. The implant was grasped with a clamp and removed easily.   The patients arm was cleansed of betadine and a steri strip was placed over the incision. A gauze was wrapped around her arm.  She tolerated the procedure well.  Instructions for care were discussed.     ASSESSMENT Contraception counseling. She doesn't like the nexplanon, expulsed a mirena, didn't remember pills. Doesn't want any more children.     PLAN Nexplanon removed. Instructions reviewed. Use condoms for contraception.  Discussed her options for contraception, she desires sterilization. Reviewed that sterilization is permanent. She is sure she doesn't want any more children, irregardless of any change in circumstances. Discussed laparoscopic tubal ligation and laparoscopic bilateral salpingectomies. She desires salpingectomies.  Discussed risks of surgery, including: bleeding, infection, damage to near by organs.   Information given   In addition to nexplanon removal, over 20 minutes was spent in total patient care in regards to contraception counseling and management.

## 2020-04-02 ENCOUNTER — Telehealth: Payer: Self-pay

## 2020-04-02 NOTE — Telephone Encounter (Signed)
Spoke with patient. Lap bilateral salpingectomy scheduled for 04/29/2020 at [facility] at 11 am. COVID test scheduled for 04/25/2020 at 9:30 am at [facility] location. Patient is aware of the need to quarantine after test until surgery. 2 week post op scheduled for 05/17/2020 at 3:30 pm with Dr.Jertson. Patient is agreeable to date and time. Surgery instructions reviewed and mailed to verified address on file. Patient verbalizes understanding.  Routing to provider and will close encounter.

## 2020-04-08 ENCOUNTER — Inpatient Hospital Stay: Payer: Managed Care, Other (non HMO) | Attending: Genetic Counselor | Admitting: Genetic Counselor

## 2020-04-08 ENCOUNTER — Inpatient Hospital Stay: Payer: Managed Care, Other (non HMO)

## 2020-04-18 ENCOUNTER — Telehealth: Payer: Self-pay

## 2020-04-18 NOTE — Telephone Encounter (Signed)
Spoke with patient. 2 wk post-op r/s to 11/11 at 4:30pm with Dr. Oscar La.   Patient states she never received surgery instructions in the mail. Confirmed mailing address on file. Advised patient surgery is scheduled at [facility] on 04/29/20, arrive at 9am. Patient will come into office today to pick up copy of her surgery instructions. Advised copy will be placed at front office for pick up. Patient verbalizes understanding.   Encounter closed.

## 2020-04-18 NOTE — Telephone Encounter (Signed)
Patients 2 week post op appointment was canceled due to doctor being out of office. Need triage to help reschedule.

## 2020-04-19 ENCOUNTER — Other Ambulatory Visit: Payer: Self-pay

## 2020-04-19 ENCOUNTER — Encounter (HOSPITAL_BASED_OUTPATIENT_CLINIC_OR_DEPARTMENT_OTHER): Payer: Self-pay | Admitting: Obstetrics and Gynecology

## 2020-04-19 NOTE — Progress Notes (Signed)
Spoke w/ via phone for pre-op interview---Margaret Mora Margaret Clock RN  Lab needs dos----   Urine preg             Lab results------none  COVID test ------10/21 at 0930am Arrive at -------0900 NPO after MN NO Solid Food.  Clear liquids from MN until---0800 Medications to take morning of surgery -----none  Diabetic medication -----none  Patient Special Instructions -----none  Pre-Op special Istructions -----none  Patient verbalized understanding of instructions that were given at this phone interview. Patient denies shortness of breath, chest pain, fever, cough at this phone interview.

## 2020-04-23 ENCOUNTER — Telehealth: Payer: Self-pay

## 2020-04-23 NOTE — Telephone Encounter (Signed)
Routing to [employer] for review.

## 2020-04-23 NOTE — Telephone Encounter (Signed)
Routing to [place], Charity fundraiser for [employer]

## 2020-04-23 NOTE — Telephone Encounter (Signed)
FMLA forms completed and faxed to fax number provided by patient. Left detailed message, okay per ROI, for patient advising her that these have been completed and sent over. Advised to return call with any additional questions. Encounter closed.

## 2020-04-23 NOTE — Telephone Encounter (Signed)
New message    FMLA forms  Fax number [phone]   Email: [email or web address]  Attention : Byrd Hesselbach   Phone # [phone]  Company Name : [employer]

## 2020-04-23 NOTE — Telephone Encounter (Signed)
F/u   [employer] e-mail  : [email or web address]

## 2020-04-23 NOTE — Telephone Encounter (Signed)
New message    The patient calling back checking on the status of FMLA paperwork that needed to be filled urgently voiced she had spoken to a nurse on yesterday.

## 2020-04-23 NOTE — Telephone Encounter (Signed)
FMLA forms faxed to [phone] attention Byrd Hesselbach. Encounter closed.

## 2020-04-25 ENCOUNTER — Other Ambulatory Visit (HOSPITAL_COMMUNITY)
Admission: RE | Admit: 2020-04-25 | Discharge: 2020-04-25 | Disposition: A | Discharge status: Home / Self Care | Payer: Managed Care, Other (non HMO) | Source: Ambulatory Visit | Attending: Obstetrics and Gynecology | Admitting: Obstetrics and Gynecology

## 2020-04-25 DIAGNOSIS — Z20822 Contact with and (suspected) exposure to covid-19: Secondary | ICD-10-CM | POA: Insufficient documentation

## 2020-04-25 DIAGNOSIS — Z01812 Encounter for preprocedural laboratory examination: Secondary | ICD-10-CM | POA: Diagnosis present

## 2020-04-25 LAB — SARS CORONAVIRUS 2 (TAT 6-24 HRS): SARS Coronavirus 2: NEGATIVE

## 2020-04-29 ENCOUNTER — Ambulatory Visit (HOSPITAL_BASED_OUTPATIENT_CLINIC_OR_DEPARTMENT_OTHER): Payer: Managed Care, Other (non HMO) | Admitting: Anesthesiology

## 2020-04-29 ENCOUNTER — Ambulatory Visit (HOSPITAL_BASED_OUTPATIENT_CLINIC_OR_DEPARTMENT_OTHER)
Admission: RE | Admit: 2020-04-29 | Discharge: 2020-04-29 | Disposition: A | Discharge status: Home / Self Care | Payer: Managed Care, Other (non HMO) | Attending: Obstetrics and Gynecology | Admitting: Obstetrics and Gynecology

## 2020-04-29 ENCOUNTER — Other Ambulatory Visit: Payer: Self-pay

## 2020-04-29 ENCOUNTER — Encounter (HOSPITAL_BASED_OUTPATIENT_CLINIC_OR_DEPARTMENT_OTHER): Payer: Self-pay | Admitting: Obstetrics and Gynecology

## 2020-04-29 ENCOUNTER — Encounter (HOSPITAL_BASED_OUTPATIENT_CLINIC_OR_DEPARTMENT_OTHER)
Admission: RE | Disposition: A | Discharge status: Home / Self Care | Payer: Self-pay | Source: Home / Self Care | Attending: Obstetrics and Gynecology

## 2020-04-29 DIAGNOSIS — E669 Obesity, unspecified: Secondary | ICD-10-CM | POA: Diagnosis not present

## 2020-04-29 DIAGNOSIS — Z6830 Body mass index (BMI) 30.0-30.9, adult: Secondary | ICD-10-CM | POA: Diagnosis not present

## 2020-04-29 DIAGNOSIS — Z79899 Other long term (current) drug therapy: Secondary | ICD-10-CM | POA: Insufficient documentation

## 2020-04-29 DIAGNOSIS — Z302 Encounter for sterilization: Secondary | ICD-10-CM | POA: Insufficient documentation

## 2020-04-29 DIAGNOSIS — Z803 Family history of malignant neoplasm of breast: Secondary | ICD-10-CM | POA: Diagnosis not present

## 2020-04-29 HISTORY — PX: LAPAROSCOPIC BILATERAL SALPINGECTOMY: SHX5889

## 2020-04-29 LAB — POCT PREGNANCY, URINE: Preg Test, Ur: NEGATIVE

## 2020-04-29 SURGERY — SALPINGECTOMY, BILATERAL, LAPAROSCOPIC
Anesthesia: General | Site: Abdomen | Laterality: Bilateral

## 2020-04-29 MED ORDER — LIDOCAINE 2% (20 MG/ML) 5 ML SYRINGE
INTRAMUSCULAR | Status: AC
  Filled 2020-04-29: qty 5

## 2020-04-29 MED ORDER — MIDAZOLAM HCL 2 MG/2ML IJ SOLN
INTRAMUSCULAR | Status: AC
  Filled 2020-04-29: qty 2

## 2020-04-29 MED ORDER — FENTANYL CITRATE (PF) 100 MCG/2ML IJ SOLN: 25.0000 ug | INTRAMUSCULAR | Status: DC | PRN

## 2020-04-29 MED ORDER — PROPOFOL 10 MG/ML IV BOLUS
INTRAVENOUS | Status: AC
  Filled 2020-04-29: qty 20

## 2020-04-29 MED ORDER — ROCURONIUM BROMIDE 10 MG/ML (PF) SYRINGE
PREFILLED_SYRINGE | INTRAVENOUS | Status: AC
  Filled 2020-04-29: qty 10

## 2020-04-29 MED ORDER — ONDANSETRON HCL 4 MG/2ML IJ SOLN
INTRAMUSCULAR | Status: AC
  Filled 2020-04-29: qty 2

## 2020-04-29 MED ORDER — OXYCODONE HCL 5 MG PO TABS
ORAL_TABLET | ORAL | Status: AC
  Filled 2020-04-29: qty 1

## 2020-04-29 MED ORDER — PROPOFOL 10 MG/ML IV BOLUS
INTRAVENOUS | Status: DC | PRN
  Administered 2020-04-29: 200 mg via INTRAVENOUS

## 2020-04-29 MED ORDER — IBUPROFEN 800 MG PO TABS: 800.0000 mg | ORAL_TABLET | Freq: Three times a day (TID) | ORAL | 0 refills | Status: AC | PRN

## 2020-04-29 MED ORDER — MIDAZOLAM HCL 5 MG/5ML IJ SOLN
INTRAMUSCULAR | Status: DC | PRN
  Administered 2020-04-29: 2 mg via INTRAVENOUS

## 2020-04-29 MED ORDER — FENTANYL CITRATE (PF) 250 MCG/5ML IJ SOLN
INTRAMUSCULAR | Status: AC
  Filled 2020-04-29: qty 5

## 2020-04-29 MED ORDER — LIDOCAINE 2% (20 MG/ML) 5 ML SYRINGE
INTRAMUSCULAR | Status: DC | PRN
  Administered 2020-04-29: 80 mg via INTRAVENOUS

## 2020-04-29 MED ORDER — OXYCODONE HCL 5 MG PO TABS
5.0000 mg | ORAL_TABLET | Freq: Once | ORAL | Status: AC | PRN
  Administered 2020-04-29: 5 mg via ORAL

## 2020-04-29 MED ORDER — POVIDONE-IODINE 10 % EX SWAB: 2.0000 "application " | Freq: Once | CUTANEOUS | Status: DC

## 2020-04-29 MED ORDER — OXYCODONE HCL 5 MG PO TABS: 5.0000 mg | ORAL_TABLET | Freq: Four times a day (QID) | ORAL | 0 refills | Status: DC | PRN

## 2020-04-29 MED ORDER — ROCURONIUM BROMIDE 10 MG/ML (PF) SYRINGE
PREFILLED_SYRINGE | INTRAVENOUS | Status: DC | PRN
  Administered 2020-04-29: 50 mg via INTRAVENOUS

## 2020-04-29 MED ORDER — LACTATED RINGERS IV SOLN: INTRAVENOUS | Status: DC

## 2020-04-29 MED ORDER — DEXAMETHASONE SODIUM PHOSPHATE 10 MG/ML IJ SOLN
INTRAMUSCULAR | Status: AC
  Filled 2020-04-29: qty 1

## 2020-04-29 MED ORDER — ONDANSETRON HCL 4 MG/2ML IJ SOLN
INTRAMUSCULAR | Status: DC | PRN
  Administered 2020-04-29: 4 mg via INTRAVENOUS

## 2020-04-29 MED ORDER — OXYCODONE HCL 5 MG/5ML PO SOLN: 5.0000 mg | Freq: Once | ORAL | Status: AC | PRN

## 2020-04-29 MED ORDER — SUGAMMADEX SODIUM 200 MG/2ML IV SOLN
INTRAVENOUS | Status: DC | PRN
  Administered 2020-04-29: 200 mg via INTRAVENOUS

## 2020-04-29 MED ORDER — PROMETHAZINE HCL 25 MG/ML IJ SOLN: 6.2500 mg | INTRAMUSCULAR | Status: DC | PRN

## 2020-04-29 MED ORDER — DEXAMETHASONE SODIUM PHOSPHATE 10 MG/ML IJ SOLN
INTRAMUSCULAR | Status: DC | PRN
  Administered 2020-04-29: 10 mg via INTRAVENOUS

## 2020-04-29 MED ORDER — FENTANYL CITRATE (PF) 100 MCG/2ML IJ SOLN
INTRAMUSCULAR | Status: DC | PRN
  Administered 2020-04-29 (×2): 50 ug via INTRAVENOUS
  Administered 2020-04-29: 100 ug via INTRAVENOUS

## 2020-04-29 MED ORDER — BUPIVACAINE HCL (PF) 0.25 % IJ SOLN
INTRAMUSCULAR | Status: DC | PRN
  Administered 2020-04-29: 5 mL

## 2020-04-29 SURGICAL SUPPLY — 48 items
ADH SKN CLS APL DERMABOND .7 (GAUZE/BANDAGES/DRESSINGS) ×1
APL SRG 38 LTWT LNG FL B (MISCELLANEOUS)
APPLICATOR ARISTA FLEXITIP XL (MISCELLANEOUS) IMPLANT
BAG RETRIEVAL 10 (BASKET)
BAG RETRIEVAL 10MM (BASKET)
CABLE HIGH FREQUENCY MONO STRZ (ELECTRODE) IMPLANT
CANISTER SUCT 3000ML PPV (MISCELLANEOUS) IMPLANT
COVER MAYO STAND STRL (DRAPES) ×3 IMPLANT
COVER WAND RF STERILE (DRAPES) ×3 IMPLANT
DERMABOND ADVANCED (GAUZE/BANDAGES/DRESSINGS) ×2
DERMABOND ADVANCED .7 DNX12 (GAUZE/BANDAGES/DRESSINGS) ×1 IMPLANT
DISSECTOR BLUNT TIP ENDO 5MM (MISCELLANEOUS) IMPLANT
DRSG OPSITE POSTOP 3X4 (GAUZE/BANDAGES/DRESSINGS) IMPLANT
DURAPREP 26ML APPLICATOR (WOUND CARE) ×3 IMPLANT
ELECT REM PT RETURN 9FT ADLT (ELECTROSURGICAL) ×3
ELECTRODE REM PT RTRN 9FT ADLT (ELECTROSURGICAL) ×1 IMPLANT
GAUZE 4X4 16PLY RFD (DISPOSABLE) ×3 IMPLANT
GLOVE BIO SURGEON STRL SZ 6.5 (GLOVE) ×4 IMPLANT
GLOVE BIO SURGEON STRL SZ7 (GLOVE) ×6 IMPLANT
GLOVE BIO SURGEONS STRL SZ 6.5 (GLOVE) ×2
GLOVE BIOGEL PI IND STRL 7.0 (GLOVE) ×2 IMPLANT
GLOVE BIOGEL PI IND STRL 7.5 (GLOVE) ×1 IMPLANT
GLOVE BIOGEL PI INDICATOR 7.0 (GLOVE) ×4
GLOVE BIOGEL PI INDICATOR 7.5 (GLOVE) ×2
GOWN STRL REUS W/TWL LRG LVL3 (GOWN DISPOSABLE) ×12 IMPLANT
HEMOSTAT ARISTA ABSORB 3G PWDR (HEMOSTASIS) IMPLANT
LIGASURE VESSEL 5MM BLUNT TIP (ELECTROSURGICAL) ×3 IMPLANT
NEEDLE INSUFFLATION 120MM (ENDOMECHANICALS) ×3 IMPLANT
NS IRRIG 500ML POUR BTL (IV SOLUTION) ×6 IMPLANT
PACK LAPAROSCOPY BASIN (CUSTOM PROCEDURE TRAY) ×3 IMPLANT
PAD OB MATERNITY 4.3X12.25 (PERSONAL CARE ITEMS) ×3 IMPLANT
POUCH LAPAROSCOPIC INSTRUMENT (MISCELLANEOUS) ×3 IMPLANT
SCISSORS LAP 5X35 DISP (ENDOMECHANICALS) IMPLANT
SET IRRIG Y TYPE TUR BLADDER L (SET/KITS/TRAYS/PACK) IMPLANT
SET SUCTION IRRIG HYDROSURG (IRRIGATION / IRRIGATOR) IMPLANT
SET TUBE SMOKE EVAC HIGH FLOW (TUBING) ×3 IMPLANT
SHEARS HARMONIC ACE PLUS 36CM (ENDOMECHANICALS) IMPLANT
SUT VIC AB 4-0 PS2 18 (SUTURE) ×3 IMPLANT
SYS BAG RETRIEVAL 10MM (BASKET)
SYSTEM BAG RETRIEVAL 10MM (BASKET) IMPLANT
SYSTEM CARTER THOMASON II (TROCAR) IMPLANT
TOWEL OR 17X26 10 PK STRL BLUE (TOWEL DISPOSABLE) ×6 IMPLANT
TRAY FOLEY W/BAG SLVR 14FR LF (SET/KITS/TRAYS/PACK) IMPLANT
TROCAR ADV FIXATION 11X100MM (TROCAR) IMPLANT
TROCAR ADV FIXATION 5X100MM (TROCAR) ×3 IMPLANT
TROCAR BLADELESS OPT 5 100 (ENDOMECHANICALS) ×3 IMPLANT
TROCAR XCEL NON-BLD 11X100MML (ENDOMECHANICALS) IMPLANT
WARMER LAPAROSCOPE (MISCELLANEOUS) ×3 IMPLANT

## 2020-04-29 NOTE — Anesthesia Preprocedure Evaluation (Addendum)
Anesthesia Evaluation  Patient identified by MRN, date of birth, ID band Patient awake    Reviewed: Allergy & Precautions, NPO status , Patient's Chart, lab work & pertinent test results  History of Anesthesia Complications Negative for: history of anesthetic complications  Airway Mallampati: II  TM Distance: >3 FB Neck ROM: Full    Dental  (+) Dental Advisory Given, Chipped,    Pulmonary neg pulmonary ROS,    Pulmonary exam normal        Cardiovascular negative cardio ROS Normal cardiovascular exam     Neuro/Psych negative neurological ROS  negative psych ROS   GI/Hepatic negative GI ROS, Neg liver ROS,   Endo/Other   Obesity   Renal/GU negative Renal ROS     Musculoskeletal negative musculoskeletal ROS (+)   Abdominal   Peds  Hematology negative hematology ROS (+)   Anesthesia Other Findings Covid test negative   Reproductive/Obstetrics                            Anesthesia Physical Anesthesia Plan  ASA: I  Anesthesia Plan: General   Post-op Pain Management:    Induction: Intravenous  PONV Risk Score and Plan: 3 and Treatment may vary due to age or medical condition, Ondansetron, Dexamethasone and Midazolam  Airway Management Planned: Oral ETT  Additional Equipment: None  Intra-op Plan:   Post-operative Plan: Extubation in OR  Informed Consent: I have reviewed the patients History and Physical, chart, labs and discussed the procedure including the risks, benefits and alternatives for the proposed anesthesia with the patient or authorized representative who has indicated his/her understanding and acceptance.     Dental advisory given  Plan Discussed with: CRNA and Anesthesiologist  Anesthesia Plan Comments:        Anesthesia Quick Evaluation

## 2020-04-29 NOTE — Op Note (Signed)
Preoperative Diagnosis: desires permanent sterilization  Postoperative Diagnosis: Same   Procedure:  Laparoscopic bilateral salpingectomies  Surgeon: Dr Gertie Exon  Assistant: none  Anesthesia: General  EBL: 5 cc  Fluids: 700 cc LR  Urine output: not recorded  Complications: none  Indications for surgery: The patient is a 29 year old female P2, who presented desiring permanent sterilization. She is sure she doesn't want any more children. She hasn't tolerated a mirena IUD or nexplanon. The patient is aware of the risks and complications involved with the surgery and consent was obtained prior to the procedure.  Findings: EUA: normal sized uterus, no adnexal masses. Laparoscopy: anterior uterus with dense adhesions to the abdominal wall. Normal adnexa bilaterally, normal liver edge.   Procedure: The patient voided just prior to going to the operating room. She was taken to the operating room with an IV in placed. She was placed in the dorsal lithotomy position. General anesthesia was administered. She was prepped and draped in the usual sterile fashion for an abdominal, vaginal surgery. A time out was performed.   A tenaculum was placed in her cervix and a sound was placed in her uterine cavity, they were taped together for uterine manipulation.   Attention was then turned to the abdomen. The umbilicus was everted, injected with 0.25% marcaine and incised with a # 11 blade. 2 towel clips were used to elevated the umbilicus and a veress needle was placed into the abdominal cavity. With insertion of the verress needle it was apparent that the incision in the umbilicus, entered the abdominal cavity. The veress needle was removed and the 5 mm laparoscope was placed into the abdominal cavity using the opti-view trocar. The abdomen was insufflated with CO2. The patient was placed in trendelenburg and the abdominal pelvic cavity was inspected. 2 more trocars were placed. 1 in the left  lower  quadrant approximately 3 cm medial to the anterior superior iliac spine, and one approximately 5 cm superior to the pubic symphysis in the midline. These areas were injected with 0.25% marcaine, incised with a #11 blades. A 5 mm trocar was inserted through each of these incisions with direct visualization with the laparoscope. The abdominal pelvic cavity was again inspected.   The left tube was grasped and elevated away from the pelvic sidewall. The ligasure device was used to sequentially cauterize and cut the mesosalpinx separating the tube from the ovary, up the broad ligament to the level of the uterus. The tube was cauterized and transected at its insertion into the cornual region of the uterus. The same procedure was repeated on the right. Hemostasis was excellent. Abdominal pressure was decreased and again hemostasis was excellent.   The abdominal cavity was desufflated and the trocars were removed. The skin was closed with subcuticular stiches of 4-0 vicryl and dermabond was placed over the incisions.  The patient's abdomen and perineum were cleansed and she was taken out of the dorsal lithotomy position. Upon awakening she was extubated and taken to the recovery room in stable condition. The sponge and instrument counts were correct.

## 2020-04-29 NOTE — Anesthesia Procedure Notes (Signed)
Procedure Name: Intubation Date/Time: 04/29/2020 10:18 AM Performed by: Rogers Blocker, CRNA Pre-anesthesia Checklist: Patient identified, Emergency Drugs available, Suction available and Patient being monitored Patient Re-evaluated:Patient Re-evaluated prior to induction Oxygen Delivery Method: Circle System Utilized Preoxygenation: Pre-oxygenation with 100% oxygen Induction Type: IV induction Ventilation: Mask ventilation without difficulty Laryngoscope Size: Mac and 3 Grade View: Grade I Tube type: Oral Number of attempts: 1 Airway Equipment and Method: Stylet Placement Confirmation: ETT inserted through vocal cords under direct vision,  positive ETCO2 and breath sounds checked- equal and bilateral Secured at: 22 cm Tube secured with: Tape Dental Injury: Teeth and Oropharynx as per pre-operative assessment

## 2020-04-29 NOTE — Transfer of Care (Signed)
Immediate Anesthesia Transfer of Care Note  Patient: Margaret Mora  Procedure(s) Performed: LAPAROSCOPIC BILATERAL SALPINGECTOMY (Bilateral Abdomen)  Patient Location: PACU  Anesthesia Type:General  Level of Consciousness: awake, alert , oriented and patient cooperative  Airway & Oxygen Therapy: Patient Spontanous Breathing and Patient connected to nasal cannula oxygen  Post-op Assessment: Report given to RN, Post -op Vital signs reviewed and stable and Patient moving all extremities  Post vital signs: Reviewed and stable  Last Vitals:  Vitals Value Taken Time  BP 147/96 04/29/20 1113  Temp    Pulse 114 04/29/20 1115  Resp 16 04/29/20 1115  SpO2 98 % 04/29/20 1115  Vitals shown include unvalidated device data.  Last Pain:  Vitals:   04/29/20 0919  TempSrc: Oral  PainSc: 0-No pain      Patients Stated Pain Goal: 5 (04/29/20 0919)  Complications: No complications documented.

## 2020-04-29 NOTE — Interval H&P Note (Signed)
History and Physical Interval Note:  04/29/2020 10:05 AM  Margaret Mora  has presented today for surgery, with the diagnosis of Encounter for permanent sterilization.  The various methods of treatment have been discussed with the patient and family. After consideration of risks, benefits and other options for treatment, the patient has consented to  Procedure(s): LAPAROSCOPIC BILATERAL SALPINGECTOMY (Bilateral) as a surgical intervention.  The patient's history has been reviewed, patient examined, no change in status, stable for surgery.  I have reviewed the patient's chart and labs.  Questions were answered to the patient's satisfaction.    Updated exam: Heart: regular rate and rhythm Lungs: CTAB Abdomen: soft, non-tender; bowel sounds normal; no masses,  no organomegaly Extremities: normal, atraumatic, no cyanosis Skin: normal color, texture and turgor, no rashes or lesions Lymph: normal cervical supraclavicular and inguinal nodes Neurologic: grossly normal    Romualdo Bolk

## 2020-04-29 NOTE — Discharge Instructions (Signed)

## 2020-04-30 ENCOUNTER — Encounter (HOSPITAL_BASED_OUTPATIENT_CLINIC_OR_DEPARTMENT_OTHER): Payer: Self-pay | Admitting: Obstetrics and Gynecology

## 2020-04-30 LAB — SURGICAL PATHOLOGY

## 2020-04-30 NOTE — Addendum Note (Signed)
Addendum  created 04/30/20 0743 by Bishop Limbo, CRNA   Charge Capture section accepted

## 2020-04-30 NOTE — Anesthesia Postprocedure Evaluation (Signed)
Anesthesia Post Note  Patient: Jeanita Carneiro Berringer  Procedure(s) Performed: LAPAROSCOPIC BILATERAL SALPINGECTOMY (Bilateral Abdomen)     Patient location during evaluation: PACU Anesthesia Type: General Level of consciousness: awake and alert Pain management: pain level controlled Vital Signs Assessment: post-procedure vital signs reviewed and stable Respiratory status: spontaneous breathing, nonlabored ventilation and respiratory function stable Cardiovascular status: blood pressure returned to baseline and stable Postop Assessment: no apparent nausea or vomiting Anesthetic complications: no   No complications documented.  Last Vitals:  Vitals:   04/29/20 1200 04/29/20 1310  BP: (!) 135/92 123/86  Pulse: 88 96  Resp: 14 14  Temp:  36.8 C  SpO2: 98% 99%                   Beryle Lathe

## 2020-05-16 ENCOUNTER — Ambulatory Visit (INDEPENDENT_AMBULATORY_CARE_PROVIDER_SITE_OTHER): Payer: Managed Care, Other (non HMO) | Admitting: Obstetrics and Gynecology

## 2020-05-16 ENCOUNTER — Other Ambulatory Visit: Payer: Self-pay

## 2020-05-16 ENCOUNTER — Encounter: Payer: Self-pay | Admitting: Obstetrics and Gynecology

## 2020-05-16 VITALS — BP 108/64 | HR 83 | Ht 62.0 in | Wt 172.0 lb

## 2020-05-16 DIAGNOSIS — Z9079 Acquired absence of other genital organ(s): Secondary | ICD-10-CM

## 2020-05-16 NOTE — Progress Notes (Signed)
GYNECOLOGY  VISIT   HPI: 29 y.o.   Divorced Black or [place] American Not Hispanic or Latino  female   [phone] with No LMP recorded. (Menstrual status: Other).   here for  2week post op for laparoscopic bilateral salpingectomy.  Patient states that she has had some light headedness only if she lifts heavy boxes at work. No pain, no bleeding, feels fine  GYNECOLOGIC HISTORY: No LMP recorded. (Menstrual status: Other). Nexplanon removed in 04/01/20 Contraception: bilateral salpingectomies Menopausal hormone therapy: none        OB History    Gravida  3   Para  2   Term  2   Preterm      AB  1   Living  2     SAB  1   TAB      Ectopic      Multiple      Live Births                 Patient Active Problem List   Diagnosis Date Noted  . Family history of breast cancer 03/13/2020    Past Medical History:  Diagnosis Date  . Abnormal Pap smear of cervix   . Anemia   . Ovarian cyst   . STD (sexually transmitted disease)    HPV    Past Surgical History:  Procedure Laterality Date  . CESAREAN SECTION    . INTRAUTERINE DEVICE (IUD) INSERTION     mirena iud inserted around 7/19  . IUD REMOVAL    . LAPAROSCOPIC BILATERAL SALPINGECTOMY Bilateral 04/29/2020   Procedure: LAPAROSCOPIC BILATERAL SALPINGECTOMY;  Surgeon: Romualdo Bolk, MD;  Location: [facility];  Service: Gynecology;  Laterality: Bilateral;    Current Outpatient Medications  Medication Sig Dispense Refill  . ibuprofen (ADVIL) 800 MG tablet Take 1 tablet (800 mg total) by mouth every 8 (eight) hours as needed. 30 tablet 0  . Multiple Vitamins-Minerals (MULTIVITAMIN WITH MINERALS) tablet Take 1 tablet by mouth daily.     No current facility-administered medications for this visit.     ALLERGIES: Patient has no known allergies.  Family History  Problem Relation Age of Onset  . Diabetes Mother   . Breast cancer Mother        14's  . Diabetes Father   . Diabetes Sister      Social History   Socioeconomic History  . Marital status: Divorced    Spouse name: Not on file  . Number of children: 2  . Years of education: 71  . Highest education level: Not on file  Occupational History  . Not on file  Tobacco Use  . Smoking status: Never Smoker  . Smokeless tobacco: Never Used  Vaping Use  . Vaping Use: Every day  . Substances: Nicotine, Flavoring  Substance and Sexual Activity  . Alcohol use: Yes    Comment: occassionally  . Drug use: No  . Sexual activity: Yes    Partners: Male    Birth control/protection: Implant  Other Topics Concern  . Not on file  Social History Narrative  . Not on file   Social Determinants of Health   Financial Resource Strain:   . Difficulty of Paying Living Expenses: Not on file  Food Insecurity:   . Worried About Programme researcher, broadcasting/film/video in the Last Year: Not on file  . Ran Out of Food in the Last Year: Not on file  Transportation Needs:   . Lack of Transportation (Medical): Not  on file  . Lack of Transportation (Non-Medical): Not on file  Physical Activity:   . Days of Exercise per Week: Not on file  . Minutes of Exercise per Session: Not on file  Stress:   . Feeling of Stress : Not on file  Social Connections:   . Frequency of Communication with Friends and Family: Not on file  . Frequency of Social Gatherings with Friends and Family: Not on file  . Attends Religious Services: Not on file  . Active Member of Clubs or Organizations: Not on file  . Attends Banker Meetings: Not on file  . Marital Status: Not on file  Intimate Partner Violence:   . Fear of Current or Ex-Partner: Not on file  . Emotionally Abused: Not on file  . Physically Abused: Not on file  . Sexually Abused: Not on file    ROS  PHYSICAL EXAMINATION:    BP 108/64   Pulse 83   Ht 5\' 2"  (1.575 m)   Wt 172 lb (78 kg)   SpO2 100%   BMI 31.46 kg/m     General appearance: alert, cooperative and appears stated age Abdomen:  soft, non-tender; non distended, no masses,  no organomegaly Incisions: well healed   ASSESSMENT Post op check 2 weeks after bilateral salpingectomies. Doing well    PLAN Routine f/u Surgery pictures reviewed with the patient

## 2020-05-17 ENCOUNTER — Ambulatory Visit: Payer: Managed Care, Other (non HMO) | Admitting: Obstetrics and Gynecology

## 2020-06-24 ENCOUNTER — Emergency Department (HOSPITAL_COMMUNITY)
Admission: EM | Admit: 2020-06-24 | Discharge: 2020-06-24 | Disposition: A | Discharge status: Home / Self Care | Payer: Managed Care, Other (non HMO) | Attending: Emergency Medicine | Admitting: Emergency Medicine

## 2020-06-24 ENCOUNTER — Other Ambulatory Visit: Payer: Self-pay

## 2020-06-24 ENCOUNTER — Encounter (HOSPITAL_COMMUNITY): Payer: Self-pay | Admitting: *Deleted

## 2020-06-24 DIAGNOSIS — R109 Unspecified abdominal pain: Secondary | ICD-10-CM

## 2020-06-24 DIAGNOSIS — Z20822 Contact with and (suspected) exposure to covid-19: Secondary | ICD-10-CM | POA: Insufficient documentation

## 2020-06-24 DIAGNOSIS — R112 Nausea with vomiting, unspecified: Secondary | ICD-10-CM | POA: Diagnosis present

## 2020-06-24 DIAGNOSIS — R197 Diarrhea, unspecified: Secondary | ICD-10-CM | POA: Diagnosis not present

## 2020-06-24 DIAGNOSIS — R42 Dizziness and giddiness: Secondary | ICD-10-CM | POA: Diagnosis not present

## 2020-06-24 LAB — CBC
HCT: 37.7 % (ref 36.0–46.0)
Hemoglobin: 12.3 g/dL (ref 12.0–15.0)
MCH: 29.6 pg (ref 26.0–34.0)
MCHC: 32.6 g/dL (ref 30.0–36.0)
MCV: 90.6 fL (ref 80.0–100.0)
Platelets: 270 10*3/uL (ref 150–400)
RBC: 4.16 MIL/uL (ref 3.87–5.11)
RDW: 12.8 % (ref 11.5–15.5)
WBC: 4.3 10*3/uL (ref 4.0–10.5)
nRBC: 0 % (ref 0.0–0.2)

## 2020-06-24 LAB — COMPREHENSIVE METABOLIC PANEL
ALT: 18 U/L (ref 0–44)
AST: 21 U/L (ref 15–41)
Albumin: 4.1 g/dL (ref 3.5–5.0)
Alkaline Phosphatase: 61 U/L (ref 38–126)
Anion gap: 10 (ref 5–15)
BUN: 8 mg/dL (ref 6–20)
CO2: 21 mmol/L — ABNORMAL LOW (ref 22–32)
Calcium: 9.6 mg/dL (ref 8.9–10.3)
Chloride: 103 mmol/L (ref 98–111)
Creatinine, Ser: 0.58 mg/dL (ref 0.44–1.00)
GFR, Estimated: >60 mL/min (ref >60)
Glucose, Bld: 100 mg/dL — ABNORMAL HIGH (ref 70–99)
Potassium: 3.8 mmol/L (ref 3.5–5.1)
Sodium: 134 mmol/L — ABNORMAL LOW (ref 135–145)
Total Bilirubin: 1 mg/dL (ref 0.3–1.2)
Total Protein: 7.5 g/dL (ref 6.5–8.1)

## 2020-06-24 LAB — URINALYSIS, ROUTINE W REFLEX MICROSCOPIC
Bilirubin Urine: NEGATIVE
Glucose, UA: 50 mg/dL — AB
Ketones, ur: NEGATIVE mg/dL
Leukocytes,Ua: NEGATIVE
Nitrite: NEGATIVE
Protein, ur: NEGATIVE mg/dL
Specific Gravity, Urine: 1.026 (ref 1.005–1.030)
pH: 5 (ref 5.0–8.0)

## 2020-06-24 LAB — I-STAT BETA HCG BLOOD, ED (MC, WL, AP ONLY): I-stat hCG, quantitative: <5 m[IU]/mL (ref <5)

## 2020-06-24 LAB — LIPASE, BLOOD: Lipase: 42 U/L (ref 11–51)

## 2020-06-24 LAB — RESP PANEL BY RT-PCR (FLU A&B, COVID) ARPGX2
Influenza A by PCR: NEGATIVE
Influenza B by PCR: NEGATIVE
SARS Coronavirus 2 by RT PCR: NEGATIVE

## 2020-06-24 MED ORDER — DICYCLOMINE HCL 20 MG PO TABS: 20.0000 mg | ORAL_TABLET | Freq: Two times a day (BID) | ORAL | 0 refills | Status: AC

## 2020-06-24 MED ORDER — ONDANSETRON 4 MG PO TBDP: ORAL_TABLET | ORAL | 0 refills | Status: AC

## 2020-06-24 MED ORDER — DICYCLOMINE HCL 10 MG/ML IM SOLN
20.0000 mg | Freq: Once | INTRAMUSCULAR | Status: AC
  Administered 2020-06-24: 10:00:00 20 mg via INTRAMUSCULAR
  Filled 2020-06-24: qty 2

## 2020-06-24 MED ORDER — ONDANSETRON 4 MG PO TBDP
4.0000 mg | ORAL_TABLET | Freq: Once | ORAL | Status: AC | PRN
  Administered 2020-06-24: 07:00:00 4 mg via ORAL
  Filled 2020-06-24: qty 1

## 2020-06-24 MED ORDER — FAMOTIDINE IN NACL 20-0.9 MG/50ML-% IV SOLN
20.0000 mg | Freq: Once | INTRAVENOUS | Status: AC
  Administered 2020-06-24: 11:00:00 20 mg via INTRAVENOUS
  Filled 2020-06-24: qty 50

## 2020-06-24 MED ORDER — FAMOTIDINE 20 MG PO TABS: 20.0000 mg | ORAL_TABLET | Freq: Two times a day (BID) | ORAL | 0 refills | Status: AC

## 2020-06-24 MED ORDER — ONDANSETRON HCL 4 MG/2ML IJ SOLN
4.0000 mg | Freq: Once | INTRAMUSCULAR | Status: AC
  Administered 2020-06-24: 10:00:00 4 mg via INTRAVENOUS
  Filled 2020-06-24: qty 2

## 2020-06-24 MED ORDER — SODIUM CHLORIDE 0.9 % IV BOLUS
1000.0000 mL | Freq: Once | INTRAVENOUS | Status: AC
  Administered 2020-06-24: 10:00:00 1000 mL via INTRAVENOUS

## 2020-06-24 NOTE — ED Provider Notes (Signed)
[facility] EMERGENCY DEPARTMENT Provider Note   CSN: [id number] Arrival date & time: 06/24/20  2297     History Chief Complaint  Patient presents with  . Emesis  . Diarrhea    Margaret Mora is a 29 y.o. female.  Margaret Mora is a 29 y.o. female with history of anemia, ovarian cyst ED for evaluation of nausea, vomiting and diarrhea.  She reports that Saturday night she started feeling a bit unwell and was a bit lightheaded, and Sunday began having vomiting and diarrhea.  She denies any blood in her emesis or stool.  Reports she has had intermittent abdominal cramping that usually worse right before she has to have an episode of vomiting or diarrhea.  She reports she has not been able to keep much down since the symptoms started.  She has had some chills but no documented fevers.  No associated cough, congestion, loss of taste or smell.  No chest pain or shortness of breath.  Patient has had Covid vaccines.  Denies any known sick contacts.  Did not eat anything out of the ordinary prior to symptoms beginning.  She has not taken anything to treat the symptoms prior to arrival.  No other aggravating or alleviating factors.        Past Medical History:  Diagnosis Date  . Abnormal Pap smear of cervix   . Anemia   . Ovarian cyst   . STD (sexually transmitted disease)    HPV    Patient Active Problem List   Diagnosis Date Noted  . Family history of breast cancer 03/13/2020    Past Surgical History:  Procedure Laterality Date  . CESAREAN SECTION    . INTRAUTERINE DEVICE (IUD) INSERTION     mirena iud inserted around 7/19  . IUD REMOVAL    . LAPAROSCOPIC BILATERAL SALPINGECTOMY Bilateral 04/29/2020   Procedure: LAPAROSCOPIC BILATERAL SALPINGECTOMY;  Surgeon: Jertson, Jill Evelyn, MD;  Location: [facility];  Service: Gynecology;  Laterality: Bilateral;     OB History    Gravida  3   Para  2   Term  2   Preterm       AB  1   Living  2     SAB  1   IAB      Ectopic      Multiple      Live Births              Family History  Problem Relation Age of Onset  . Diabetes Mother   . Breast cancer Mother        30 's  . Diabetes Father   . Diabetes Sister     Social History   Tobacco Use  . Smoking status: Never Smoker  . Smokeless tobacco: Never Used  Vaping Use  . Vaping Use: Every day  . Substances: Nicotine, Flavoring  Substance Use Topics  . Alcohol use: Yes    Comment: occassionally  . Drug use: No    Home Medications Prior to Admission medications   Medication Sig Start Date End Date Taking? Authorizing Provider  dicyclomine (BENTYL) 20 MG tablet Take 1 tablet (20 mg total) by mouth 2 (two) times daily. 06/24/20   06/26/20, PA-C  famotidine (PEPCID) 20 MG tablet Take 1 tablet (20 mg total) by mouth 2 (two) times daily. 06/24/20   06/26/20, PA-C  ibuprofen (ADVIL) 800 MG tablet Take 1 tablet (800 mg total) by mouth every  8 (eight) hours as needed. Patient not taking: No sig reported 04/29/20   Romualdo Bolk, MD  ondansetron [facility] ODT) 4 MG disintegrating tablet 4mg  ODT q4 hours prn nausea/vomit 06/24/20   06/26/20, PA-C    Allergies    Patient has no known allergies.  Review of Systems   Review of Systems  Constitutional: Positive for chills. Negative for fever.  HENT: Negative for congestion, rhinorrhea and sore throat.   Respiratory: Negative for cough and shortness of breath.   Cardiovascular: Negative for chest pain.  Gastrointestinal: Positive for abdominal pain, diarrhea, nausea and vomiting. Negative for blood in stool and constipation.  Genitourinary: Negative for dysuria and frequency.  Musculoskeletal: Negative for arthralgias and myalgias.  Skin: Negative for color change and rash.  Neurological: Negative for dizziness, syncope and light-headedness.  All other systems reviewed and are negative.   Physical Exam Updated Vital  Signs BP 131/71 (BP Location: Left Arm)   Pulse (!) 105   Temp 98.2 F (36.8 C) (Oral)   Resp 18   LMP 06/16/2020   SpO2 98%   Physical Exam Vitals and nursing note reviewed.  Constitutional:      General: She is not in acute distress.    Appearance: Normal appearance. She is well-developed and well-nourished. She is not ill-appearing or diaphoretic.     Comments: Well-appearing and in no distress   HENT:     Head: Normocephalic and atraumatic.     Mouth/Throat:     Mouth: Oropharynx is clear and moist. Mucous membranes are moist.     Pharynx: Oropharynx is clear.  Eyes:     General:        Right eye: No discharge.        Left eye: No discharge.     Extraocular Movements: EOM normal.  Cardiovascular:     Rate and Rhythm: Normal rate and regular rhythm.     Pulses: Intact distal pulses.     Heart sounds: Normal heart sounds.  Pulmonary:     Effort: Pulmonary effort is normal. No respiratory distress.     Breath sounds: Normal breath sounds. No wheezing or rales.     Comments: Respirations equal and unlabored, patient able to speak in full sentences, lungs clear to auscultation bilaterally  Abdominal:     General: Bowel sounds are normal. There is no distension.     Palpations: Abdomen is soft. There is no mass.     Tenderness: There is abdominal tenderness. There is no guarding.     Comments: Abdomen is soft, nondistended, bowel sounds present throughout, patient with mild generalized abdominal tenderness that does not localize to 1 area, no guarding or peritoneal signs.  Musculoskeletal:        General: No deformity or edema.     Cervical back: Neck supple.  Skin:    General: Skin is warm and dry.     Capillary Refill: Capillary refill takes less than 2 seconds.  Neurological:     Mental Status: She is alert.     Coordination: Coordination normal.     Comments: Speech is clear, able to follow commands Moves extremities without ataxia, coordination intact  Psychiatric:         Mood and Affect: Mood normal.        Behavior: Behavior normal.     ED Results / Procedures / Treatments   Labs (all labs ordered are listed, but only abnormal results are displayed) Labs Reviewed  COMPREHENSIVE METABOLIC PANEL -  Abnormal; Notable for the following components:      Result Value   Sodium 134 (*)    CO2 21 (*)    Glucose, Bld 100 (*)    All other components within normal limits  URINALYSIS, ROUTINE W REFLEX MICROSCOPIC - Abnormal; Notable for the following components:   APPearance HAZY (*)    Glucose, UA 50 (*)    Hgb urine dipstick SMALL (*)    Bacteria, UA RARE (*)    All other components within normal limits  RESP PANEL BY RT-PCR (FLU A&B, COVID) ARPGX2  LIPASE, BLOOD  CBC  I-STAT BETA HCG BLOOD, ED ([facility], [facility], [facility] ONLY)    EKG EKG Interpretation  Date/Time:  Monday June 24 2020 09:46:03 EST Ventricular Rate:  82 PR Interval:    QRS Duration: 91 QT Interval:  374 QTC Calculation: 437 R Axis:   68 Text Interpretation: Sinus rhythm Low voltage, precordial leads No significant change since last tracing Confirmed by Gwyneth Sprout ([id number]) on 06/24/2020 9:50:23 AM Also confirmed by Gwyneth Sprout ([id number]), editor Elita Quick ([id number])  on 06/24/2020 10:46:09 AM   Radiology No results found.  Procedures Procedures (including critical care time)  Medications Ordered in ED Medications  ondansetron (ZOFRAN-ODT) disintegrating tablet 4 mg (4 mg Oral Given 06/24/20 0651)  sodium chloride 0.9 % bolus 1,000 mL (0 mLs Intravenous Stopped 06/24/20 1240)  famotidine (PEPCID) IVPB 20 mg premix (0 mg Intravenous Stopped 06/24/20 1136)  dicyclomine (BENTYL) injection 20 mg (20 mg Intramuscular Given 06/24/20 0956)  ondansetron (ZOFRAN) injection 4 mg (4 mg Intravenous Given 06/24/20 1015)    ED Course  I have reviewed the triage vital signs and the nursing notes.  Pertinent labs & imaging results that were available during my care of the  patient were reviewed by me and considered in my medical decision making (see chart for details).    MDM Rules/Calculators/A&P                          Patient presents to the ED with complaints of abdominal pain. Patient nontoxic appearing, in no apparent distress, vitals WNL . On exam patient with some mild generalized abdominal tenderness that does not localize to 1 area, no peritoneal signs. Will evaluate with labs, do not feel imaging is warranted at this time. Analgesics, anti-emetics, and fluids administered.   ER work-up reviewed:  CBC: No leukocytosis, normal hemoglobin CMP: Sodium of 134, CO2 21 but no other significant electrolyte derangements, normal renal and liver function Lipase: WNL UA: Rare bacteria but no other signs of infection Preg test: Negative Covid/flu: Negative   On repeat abdominal exam patient remains without peritoneal signs, doubt cholecystitis, pancreatitis, diverticulitis, appendicitis, bowel obstruction/perforation, PID, or ectopic pregnancy. Patient tolerating PO in the emergency department.  Suspect viral or foodborne illness, she is feeling much better after treatment here in the ED.  Will discharge home with supportive measures. I discussed results, treatment plan, need for PCP follow-up, and return precautions with the patient. Provided opportunity for questions, patient confirmed understanding and is in agreement with plan.    Final Clinical Impression(s) / ED Diagnoses Final diagnoses:  Nausea vomiting and diarrhea  Abdominal cramping    Rx / DC Orders ED Discharge Orders         Ordered    famotidine (PEPCID) 20 MG tablet  2 times daily        06/24/20 1339    ondansetron (ZOFRAN ODT) 4 MG  disintegrating tablet        06/24/20 1339    dicyclomine (BENTYL) 20 MG tablet  2 times daily        06/24/20 1339           Dartha LodgeFord, Levi Crass N, [place]-C 06/24/20 1359    Gwyneth SproutPlunkett, [place], MD 06/24/20 1545

## 2020-06-24 NOTE — Discharge Instructions (Addendum)
Your lab work today is reassuring and Covid and flu tests are negative.  Your symptoms are likely due to viral or foodborne illness.  This is usually short-lived and should resolve after 2 to 3 days.  Use Zofran to help with nausea and vomiting, and Pepcid and Bentyl to help with abdominal cramping and stomach upset.  Drink plenty of liquids and eat a bland diet for the next few days and then slowly advance your diet as tolerated.  If symptoms or not resolving please follow-up with your PCP, but if you have worsening or more localized abdominal pain, fevers, or unable to keep anything down return for reevaluation.

## 2020-06-24 NOTE — ED Triage Notes (Signed)
Pt reports n/v/d since Saturday night. Feeling weak and lightheaded.

## 2020-07-24 ENCOUNTER — Ambulatory Visit (HOSPITAL_COMMUNITY)
Admission: EM | Admit: 2020-07-24 | Discharge: 2020-07-24 | Disposition: A | Discharge status: Home / Self Care | Payer: Managed Care, Other (non HMO) | Attending: Family Medicine | Admitting: Family Medicine

## 2020-07-24 ENCOUNTER — Encounter (HOSPITAL_COMMUNITY): Payer: Self-pay

## 2020-07-24 DIAGNOSIS — R059 Cough, unspecified: Secondary | ICD-10-CM | POA: Diagnosis present

## 2020-07-24 DIAGNOSIS — J069 Acute upper respiratory infection, unspecified: Secondary | ICD-10-CM

## 2020-07-24 DIAGNOSIS — U071 COVID-19: Secondary | ICD-10-CM | POA: Diagnosis not present

## 2020-07-24 LAB — SARS CORONAVIRUS 2 (TAT 6-24 HRS): SARS Coronavirus 2: POSITIVE — AB

## 2020-07-24 MED ORDER — PROMETHAZINE-DM 6.25-15 MG/5ML PO SYRP: 5.0000 mL | ORAL_SOLUTION | Freq: Four times a day (QID) | ORAL | 0 refills | Status: AC | PRN

## 2020-07-24 NOTE — ED Triage Notes (Signed)
Pt c/o sore throat, bodyaches, coughing and runny nose X 2 days. Pt states she has taken cough drops and ibuprofen with little relief. Pt states she has not been exposed to anyone that is COVID positive.

## 2020-07-24 NOTE — ED Triage Notes (Signed)
Pt states she has loss of taste.

## 2020-07-24 NOTE — ED Provider Notes (Signed)
[facility]    CSN: [id number] Arrival date & time: 07/24/20  1012      History   Chief Complaint Chief Complaint  Patient presents with  . Cough    HPI Margaret Mora is a 30 y.o. female.   Here today with 2 day history of sore throat, body aches, runny nose, cough, fatigue, eye pain. Denies known fever, wheezing, CP, SOB, N/V/D. Taking ibuprofen, cough medication with mild temporary relief. Children sick with similar sxs. No known chronic medical problems.       Past Medical History:  Diagnosis Date  . Abnormal Pap smear of cervix   . Anemia   . Ovarian cyst   . STD (sexually transmitted disease)    HPV    Patient Active Problem List   Diagnosis Date Noted  . Family history of breast cancer 03/13/2020    Past Surgical History:  Procedure Laterality Date  . CESAREAN SECTION    . INTRAUTERINE DEVICE (IUD) INSERTION     mirena iud inserted around 7/19  . IUD REMOVAL    . LAPAROSCOPIC BILATERAL SALPINGECTOMY Bilateral 04/29/2020   Procedure: LAPAROSCOPIC BILATERAL SALPINGECTOMY;  Surgeon: Romualdo Bolk, MD;  Location: [facility];  Service: Gynecology;  Laterality: Bilateral;    OB History    Gravida  3   Para  2   Term  2   Preterm      AB  1   Living  2     SAB  1   IAB      Ectopic      Multiple      Live Births               Home Medications    Prior to Admission medications   Medication Sig Start Date End Date Taking? Authorizing Provider  promethazine-dextromethorphan (PROMETHAZINE-DM) 6.25-15 MG/5ML syrup Take 5 mLs by mouth 4 (four) times daily as needed for cough. 07/24/20  Yes Particia Nearing, PA-C  dicyclomine (BENTYL) 20 MG tablet Take 1 tablet (20 mg total) by mouth 2 (two) times daily. 06/24/20   Dartha Lodge, PA-C  famotidine (PEPCID) 20 MG tablet Take 1 tablet (20 mg total) by mouth 2 (two) times daily. 06/24/20   Dartha Lodge, PA-C  ibuprofen (ADVIL) 800 MG tablet  Take 1 tablet (800 mg total) by mouth every 8 (eight) hours as needed. Patient not taking: No sig reported 04/29/20   Romualdo Bolk, MD  ondansetron [facility] ODT) 4 MG disintegrating tablet 4mg  ODT q4 hours prn nausea/vomit 06/24/20   06/26/20, PA-C    Family History Family History  Problem Relation Age of Onset  . Diabetes Mother   . Breast cancer Mother        63's  . Diabetes Father   . Diabetes Sister     Social History Social History   Tobacco Use  . Smoking status: Never Smoker  . Smokeless tobacco: Never Used  Vaping Use  . Vaping Use: Every day  . Substances: Nicotine, Flavoring  Substance Use Topics  . Alcohol use: Yes    Comment: occassionally  . Drug use: No     Allergies   Patient has no known allergies.   Review of Systems Review of Systems PER HPI   Physical Exam Triage Vital Signs ED Triage Vitals  Enc Vitals Group     BP 07/24/20 1139 139/79     Pulse Rate 07/24/20 1139 92  Resp 07/24/20 1139 16     Temp --      Temp src --      SpO2 07/24/20 1139 97 %     Weight --      Height --      Head Circumference --      Peak Flow --      Pain Score 07/24/20 1242 2     Pain Loc --      Pain Edu? --      Excl. in GC? --    No data found.  Updated Vital Signs BP 139/79 (BP Location: Right Arm)   Pulse 92   Resp 16   LMP 07/15/2020 (Approximate)   SpO2 97%   Visual Acuity Right Eye Distance:   Left Eye Distance:   Bilateral Distance:    Right Eye Near:   Left Eye Near:    Bilateral Near:     Physical Exam Vitals and nursing note reviewed.  Constitutional:      Appearance: Normal appearance. She is not ill-appearing.  HENT:     Head: Atraumatic.     Right Ear: Tympanic membrane normal.     Left Ear: Tympanic membrane normal.     Nose: Rhinorrhea present.     Mouth/Throat:     Mouth: Mucous membranes are moist.     Pharynx: Oropharynx is clear.  Eyes:     Extraocular Movements: Extraocular movements intact.      Conjunctiva/sclera: Conjunctivae normal.  Cardiovascular:     Rate and Rhythm: Normal rate and regular rhythm.     Heart sounds: Normal heart sounds.  Pulmonary:     Effort: Pulmonary effort is normal. No respiratory distress.     Breath sounds: Normal breath sounds. No wheezing or rales.  Abdominal:     General: Bowel sounds are normal. There is no distension.     Palpations: Abdomen is soft.     Tenderness: There is no abdominal tenderness. There is no guarding.  Musculoskeletal:        General: Normal range of motion.     Cervical back: Normal range of motion and neck supple.  Skin:    General: Skin is warm and dry.  Neurological:     Mental Status: She is alert and oriented to person, place, and time.  Psychiatric:        Mood and Affect: Mood normal.        Thought Content: Thought content normal.        Judgment: Judgment normal.      UC Treatments / Results  Labs (all labs ordered are listed, but only abnormal results are displayed) Labs Reviewed  SARS CORONAVIRUS 2 (TAT 6-24 HRS)    EKG   Radiology No results found.  Procedures Procedures (including critical care time)  Medications Ordered in UC Medications - No data to display  Initial Impression / Assessment and Plan / UC Course  I have reviewed the triage vital signs and the nursing notes.  Pertinent labs & imaging results that were available during my care of the patient were reviewed by me and considered in my medical decision making (see chart for details).     Vitals and exam reassuring, COVID pcr pending. Discussed phenergan DM, otc remedies, supportive care, isolation. Work note given. Return for worsening sxs.   Final Clinical Impressions(s) / UC Diagnoses   Final diagnoses:  Viral URI with cough   Discharge Instructions   None    ED Prescriptions  Medication Sig Dispense Auth. Provider   promethazine-dextromethorphan (PROMETHAZINE-DM) 6.25-15 MG/5ML syrup Take 5 mLs by mouth 4  (four) times daily as needed for cough. 100 mL Particia Nearing, [place]     PDMP not reviewed this encounter.   Particia Nearing, [place] 07/24/20 1324

## 2020-07-25 ENCOUNTER — Telehealth: Payer: Self-pay

## 2020-07-25 ENCOUNTER — Telehealth (HOSPITAL_COMMUNITY): Payer: Self-pay

## 2020-07-25 NOTE — Telephone Encounter (Signed)
Called to discuss with patient about COVID-19 symptoms and the use of one of the available treatments for those with mild to moderate Covid symptoms and at a high risk of hospitalization.  Pt appears to qualify for outpatient treatment due to co-morbid conditions and/or a member of an at-risk group in accordance with the FDA Emergency Use Authorization.    Symptom onset: 07/22/20 per chart Vaccinated: Unknown Booster? Unknown Immunocompromised? No Qualifiers: None  Unable to reach pt - No answer.   Margaret Mora

## 2020-11-04 ENCOUNTER — Other Ambulatory Visit: Payer: Self-pay

## 2020-11-04 ENCOUNTER — Encounter (HOSPITAL_COMMUNITY): Payer: Self-pay

## 2020-11-04 ENCOUNTER — Ambulatory Visit (HOSPITAL_COMMUNITY)
Admission: EM | Admit: 2020-11-04 | Discharge: 2020-11-04 | Disposition: A | Discharge status: Home / Self Care | Payer: Managed Care, Other (non HMO) | Attending: Emergency Medicine | Admitting: Emergency Medicine

## 2020-11-04 DIAGNOSIS — G4486 Cervicogenic headache: Secondary | ICD-10-CM

## 2020-11-04 DIAGNOSIS — S8011XA Contusion of right lower leg, initial encounter: Secondary | ICD-10-CM

## 2020-11-04 DIAGNOSIS — S8392XA Sprain of unspecified site of left knee, initial encounter: Secondary | ICD-10-CM

## 2020-11-04 DIAGNOSIS — S161XXA Strain of muscle, fascia and tendon at neck level, initial encounter: Secondary | ICD-10-CM

## 2020-11-04 DIAGNOSIS — S39012A Strain of muscle, fascia and tendon of lower back, initial encounter: Secondary | ICD-10-CM

## 2020-11-04 MED ORDER — BACLOFEN 10 MG PO TABS: 5.0000 mg | ORAL_TABLET | Freq: Three times a day (TID) | ORAL | 0 refills | Status: AC

## 2020-11-04 MED ORDER — DICLOFENAC SODIUM 75 MG PO TBEC: 75.0000 mg | DELAYED_RELEASE_TABLET | Freq: Two times a day (BID) | ORAL | 0 refills | Status: AC
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
## 2020-11-04 NOTE — Discharge Instructions (Signed)
No signs of severe injury. Take medication as prescribed to help with discomfort and tightness - baclofen may cause drowsiness. Do gentle stretching and heat. Go to the ER if develop severe headache or new concerning symptoms such as vomiting, changes in vision, or pass out.
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
## 2020-11-05 ENCOUNTER — Telehealth (HOSPITAL_COMMUNITY): Payer: Self-pay | Admitting: Emergency Medicine

## 2020-11-05 NOTE — Telephone Encounter (Signed)
Spoke to Tammy at [facility] on [address] about patient.  Pharmacy staff reports they need to know if patient pregnant.  Reported lmp in documentation and no recent pregnancy test.  Last pregnancy test 04/2021 and reported as negative.    Pharmacy will confirm with patient

## 2021-09-01 IMAGING — US US PELVIS COMPLETE TRANSABD/TRANSVAG W DUPLEX
1 series · 13 of 25 positions shown · non-contrast
Comparison: None.

CLINICAL DATA: Pelvic pain, primarily left-sided

EXAM:
TRANSABDOMINAL AND TRANSVAGINAL ULTRASOUND OF PELVIS
DOPPLER ULTRASOUND OF OVARIES
TECHNIQUE: Study was performed transabdominally to optimize pelvic field of
view evaluation and transvaginally to optimize internal visceral
architecture evaluation.
Color and duplex Doppler ultrasound was utilized to evaluate blood
flow to the ovaries.

[Series 1: us pelvis complete transabd/transvag w duplex · 13 of 117 slices shown]
[im 1/117]
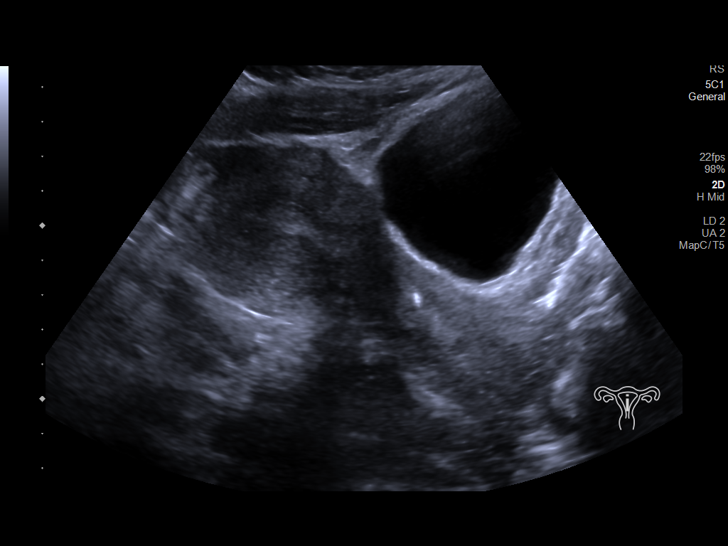
[im 10/117]
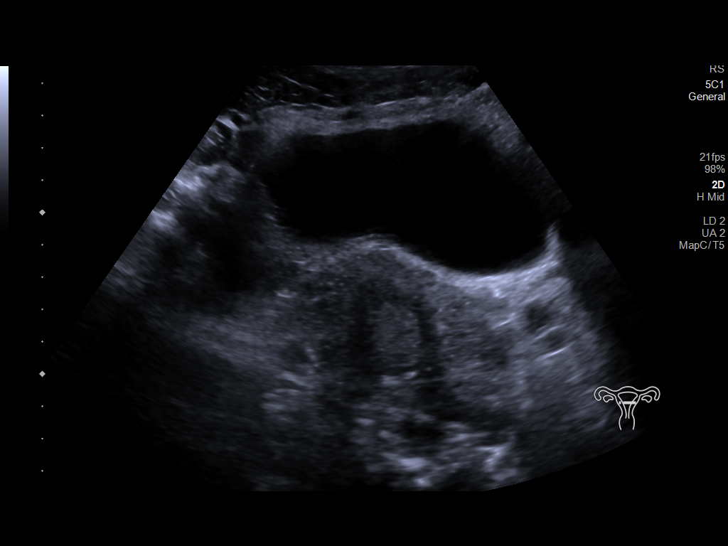
[im 20/117]
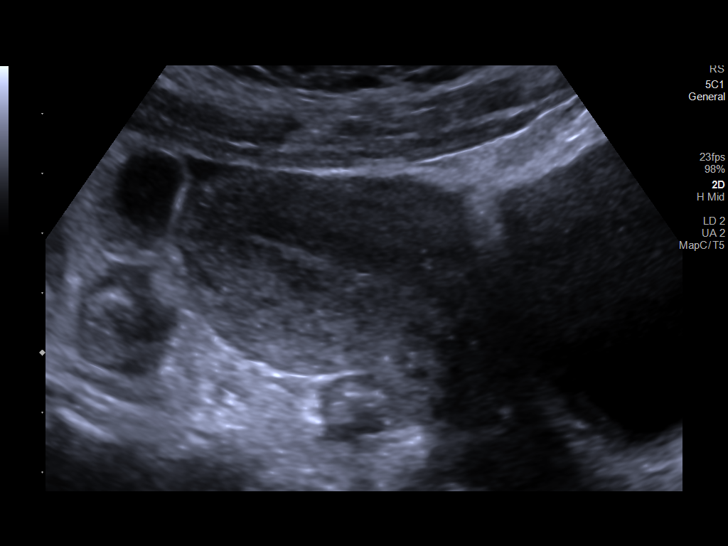
[im 30/117]
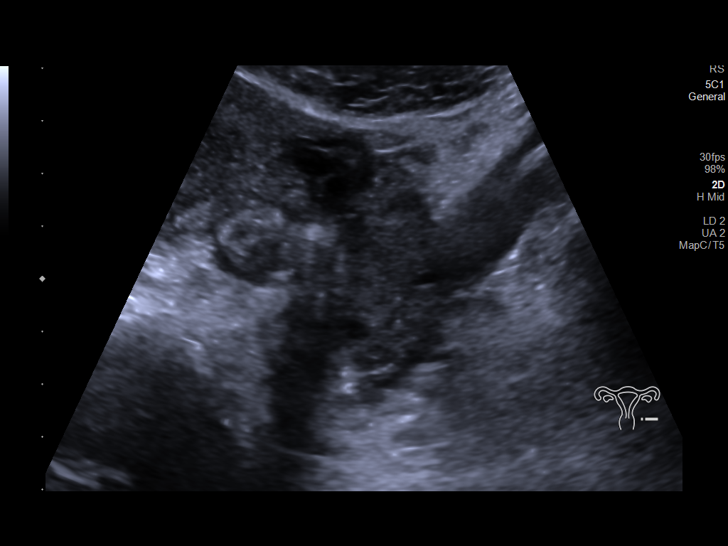
[im 39/117]
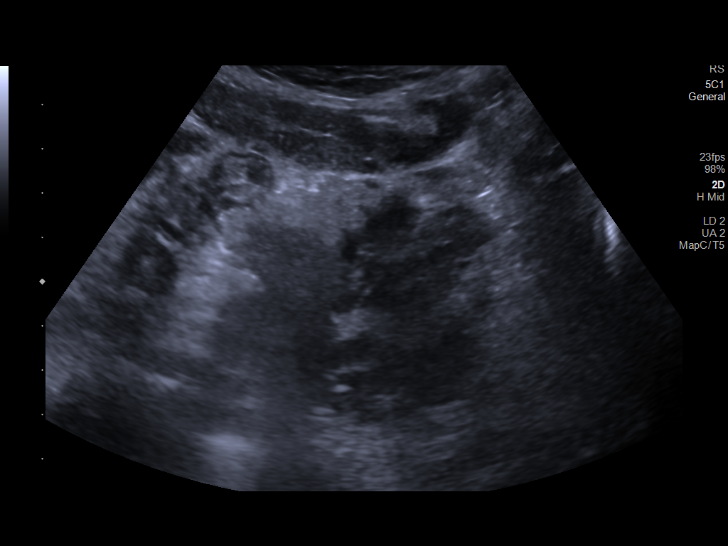
[im 49/117]
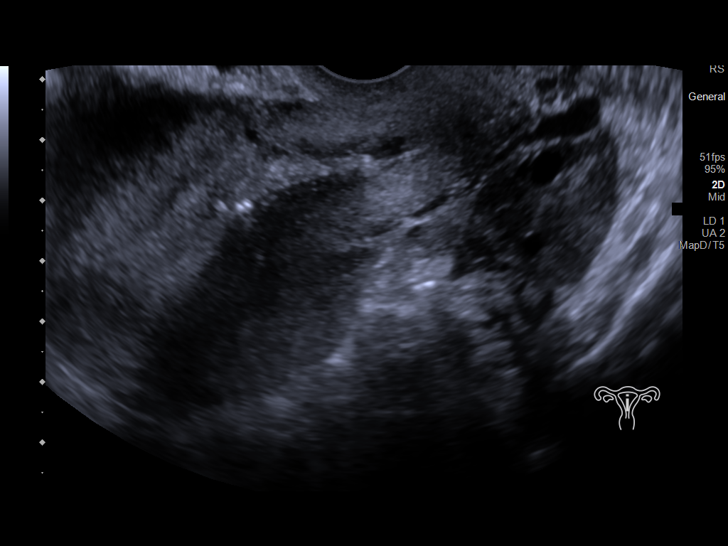
[im 59/117]
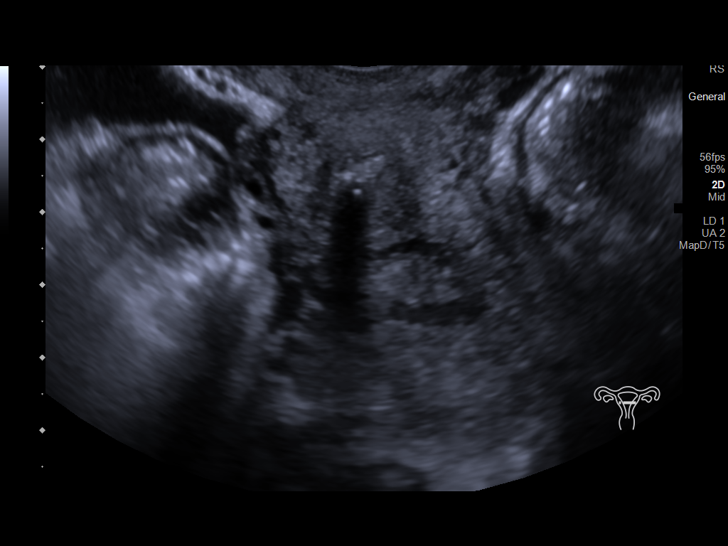
[im 68/117]
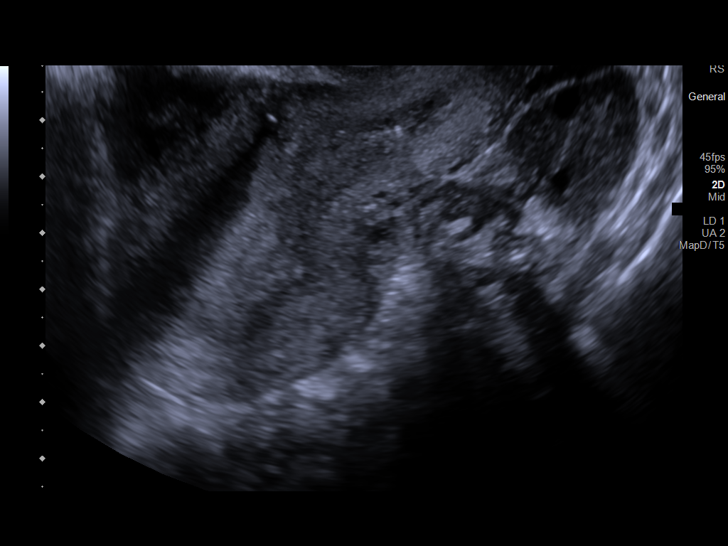
[im 78/117]
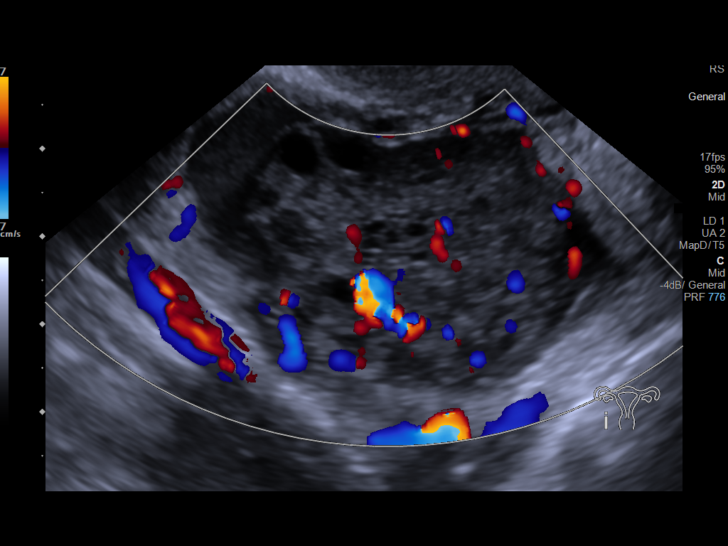
[im 88/117]
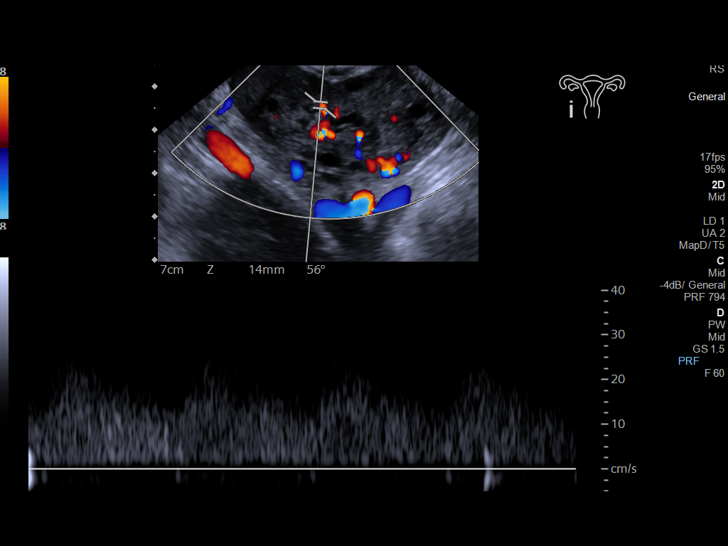
[im 97/117]
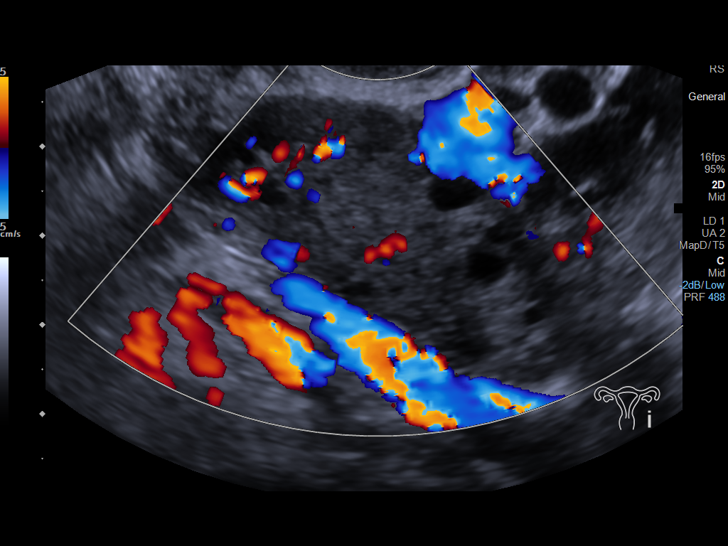
[im 107/117]
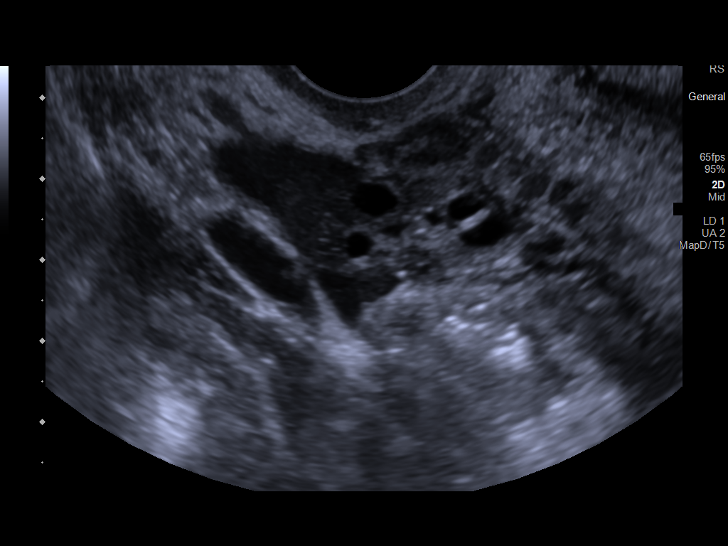
[im 117/117]
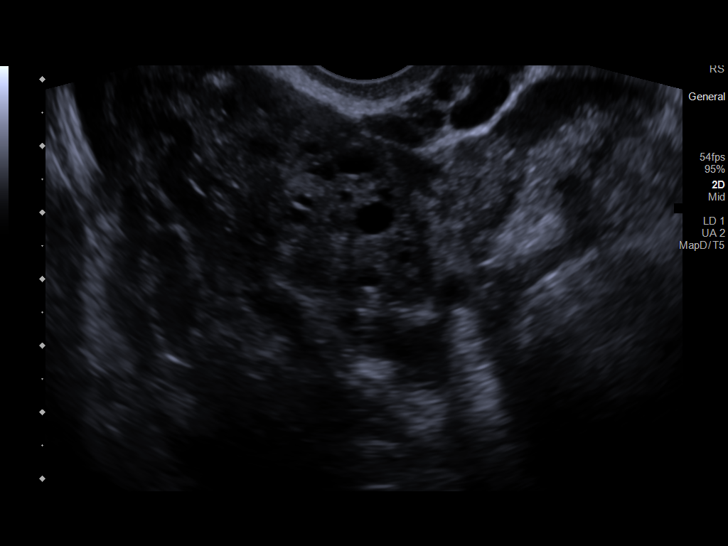

[13 of 25 positions shown; findings below may reference images not displayed]

FINDINGS: Uterus

Measurements: 12.2 x 5.3 x 4.8 cm = volume: 161.4 mL. No fibroids or
other mass visualized. There is a 6 mm cervical nabothian cyst.

Endometrium

Thickness: 11 mm. Endometrial contour is smooth. There is an
intrauterine device present in the lower uterine segment, with the
distal aspect near the uterus-cervix junction.

Right ovary

Measurements: 3.7 x 2.9 x 3.8 cm = volume: 21.9 mL. Normal
appearance/no adnexal mass.

Left ovary

Measurements: 4.3 x 2.2 x 1.7 cm = volume: 8.4 mL. Normal
appearance/no adnexal mass.

Pulsed Doppler evaluation of both ovaries demonstrates normal
low-resistance arterial and venous waveforms.

Other findings

No abnormal free fluid.
IMPRESSION: 1. Intrauterine device is inferiorly positioned within the
endometrium with the inferior aspect of the IUD near the
uterus-cervix junction. It is felt that this IUD is not in proper
position. Endometrium otherwise appears normal.

2. No extrauterine pelvic mass or fluid. Low resistance waveforms
noted in each ovary. No demonstrable ovarian torsion on either side.

3.  Subcentimeter nabothian cyst arising from cervix.

## 2022-04-11 IMAGING — MG DIGITAL DIAGNOSTIC BILAT W/ TOMO W/ CAD
8 series · 8 of 24 positions shown · non-contrast
Comparison: None.

CLINICAL DATA: Sharp, tingling, intermittent nonfocal pain in the
superior aspects of both breasts, extending from the upper chest to
the nipple area for the past month. Her mother was diagnosed with
breast cancer in her 30s.

EXAM:
DIGITAL DIAGNOSTIC BILATERAL MAMMOGRAM WITH TOMO AND CAD

[R MLO synth-2D]
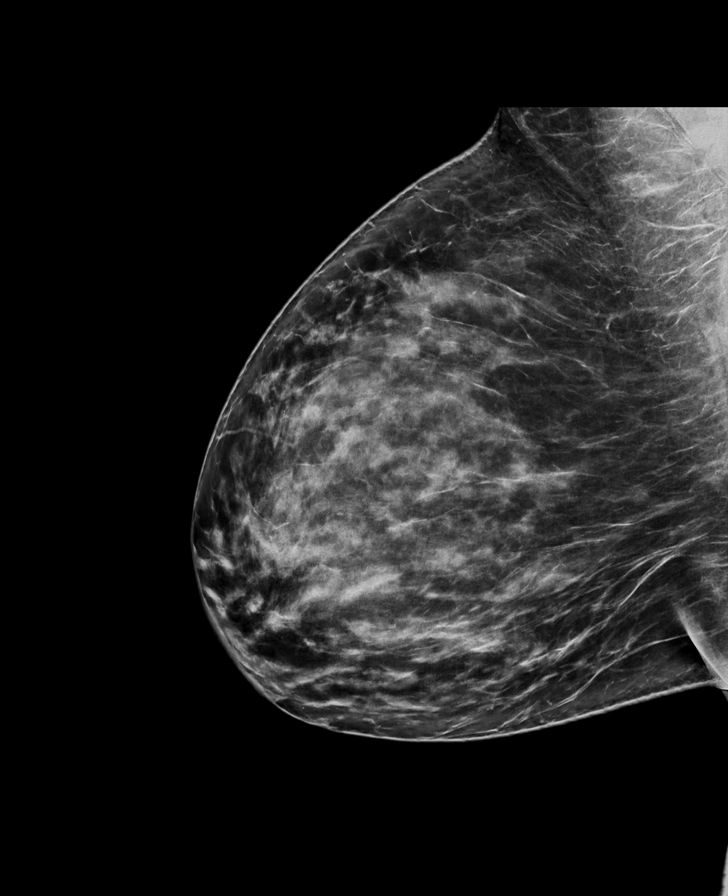

[L MLO synth-2D]
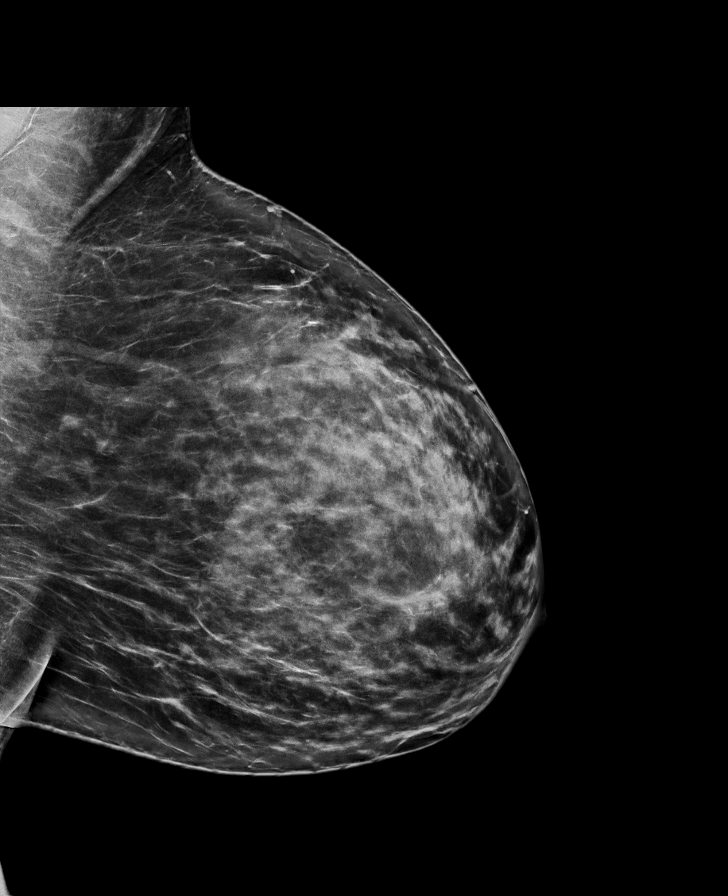

[R CC synth-2D]
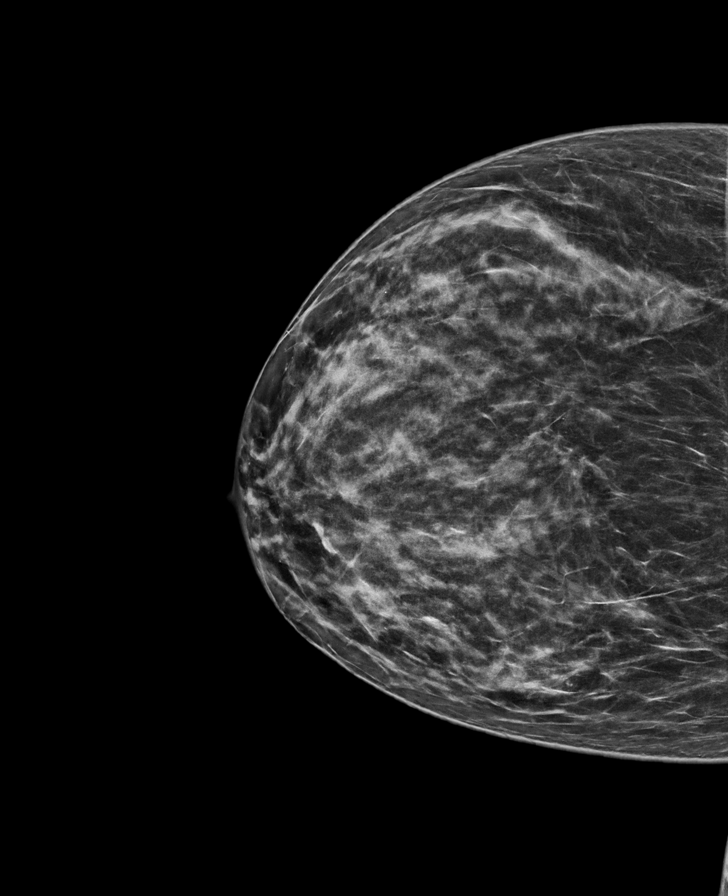

[L CC synth-2D]
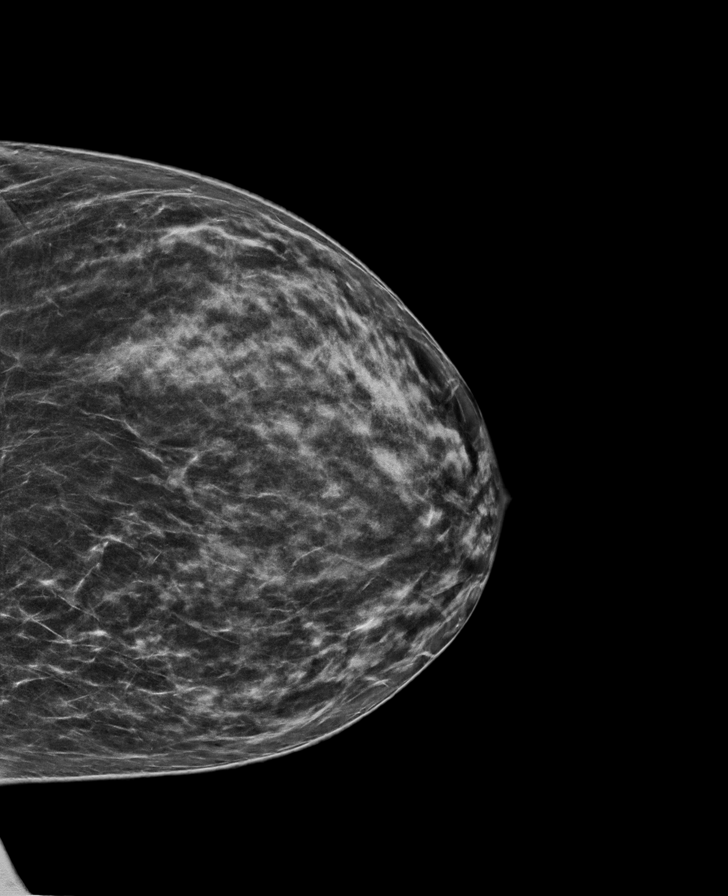

[R CC tomo · tomo slice 37/72.0]
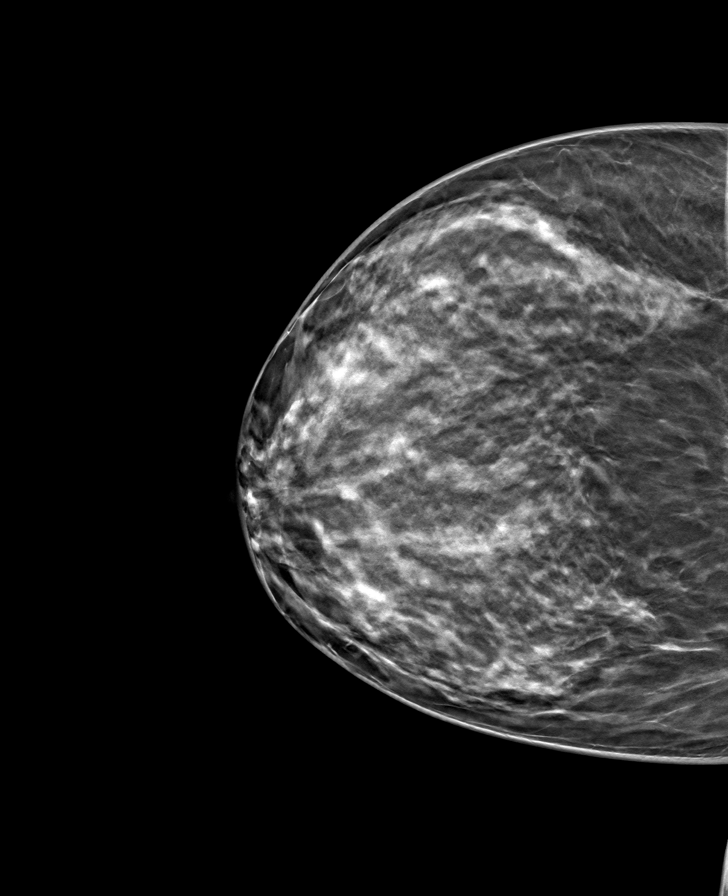

[L MLO tomo · tomo slice 41/81.0]
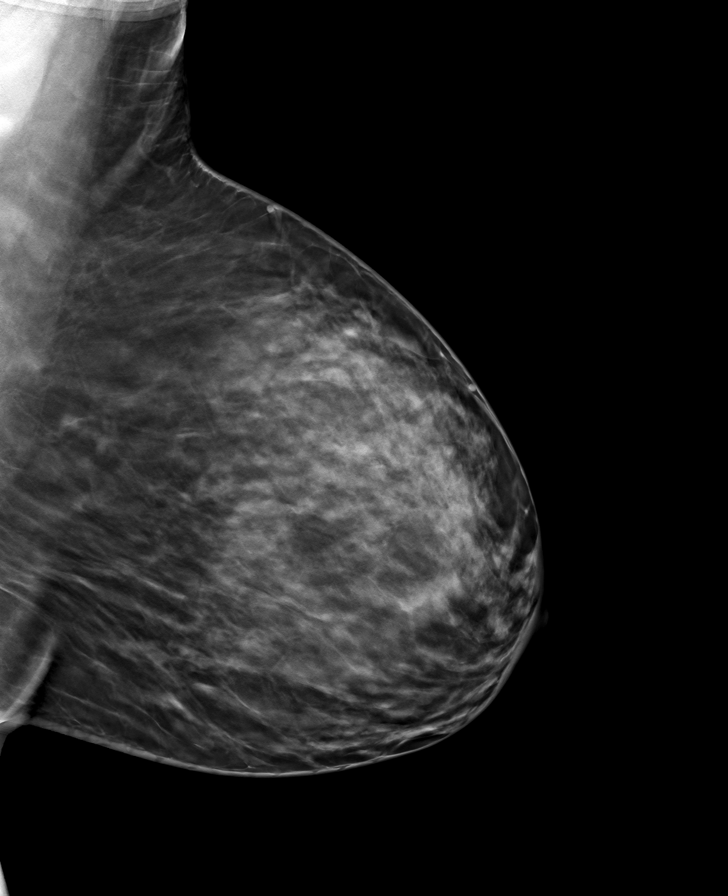

[R MLO tomo · tomo slice 43/85.0]
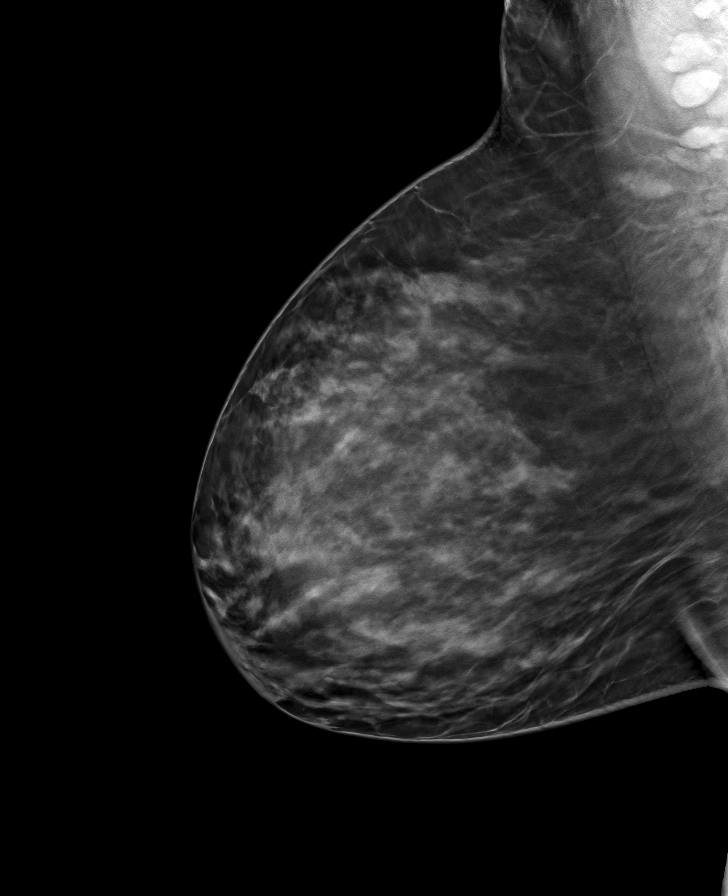

[L CC tomo · tomo slice 36/71.0]
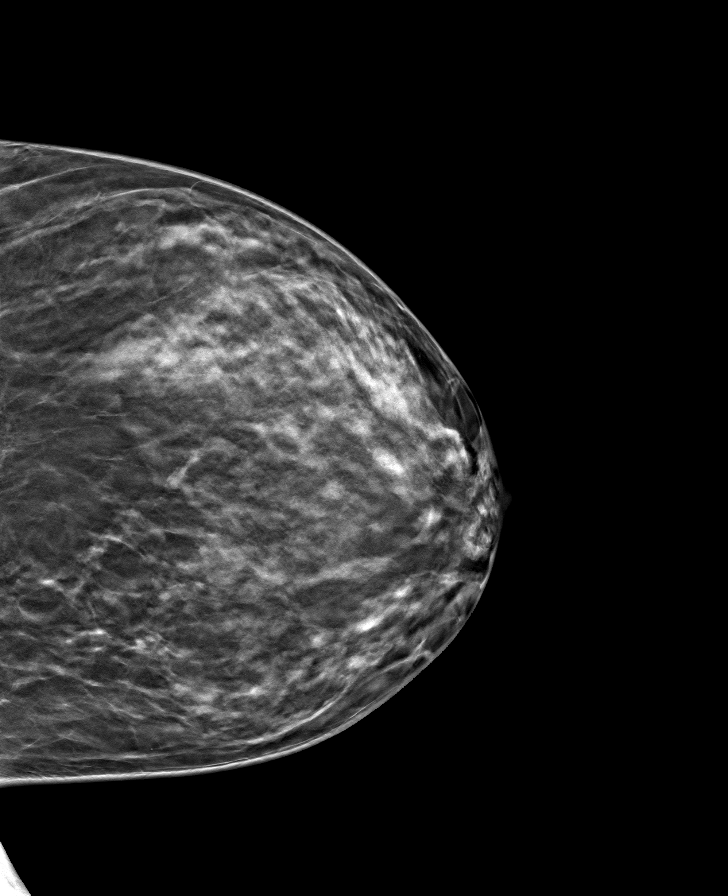

[8 of 24 positions shown; findings below may reference images not displayed]

ACR Breast Density Category c: The breast tissue is heterogeneously
dense, which may obscure small masses.
FINDINGS: Mammographically normal appearing breasts with no findings
suspicious for malignancy in either breast.

Mammographic images were processed with CAD.
IMPRESSION: No evidence of malignancy.

RECOMMENDATION:
Annual screening mammography beginning at age 30 since the patient's
mother was diagnosed with breast cancer in her 30s. This was
discussed with the patient.

I have discussed the findings and recommendations with the patient.
If applicable, a reminder letter will be sent to the patient
regarding the next appointment.

BI-RADS CATEGORY  1: Negative.

## 2022-11-16 ENCOUNTER — Ambulatory Visit: Admit: 2022-11-16 | Discharge: 2022-11-16 | Attending: Family Medicine | Primary: Diagnostic Radiology

## 2022-11-16 DIAGNOSIS — J029 Acute pharyngitis, unspecified: Secondary | ICD-10-CM

## 2022-11-16 LAB — POC COVID-19 & INFLUENZA COMBO (LIAT IN HOUSE)
Influenza A: NOT DETECTED
Influenza B: NOT DETECTED
SARS-CoV-2: NOT DETECTED

## 2022-11-16 LAB — AMB POC RAPID STREP A: Group A Strep Antigen, POC: POSITIVE

## 2022-11-16 MED ORDER — AMOXICILLIN 875 MG PO TABS
875 | ORAL_TABLET | Freq: Two times a day (BID) | ORAL | 0 refills | Status: AC
Start: 2022-11-16 — End: 2022-11-26

## 2022-11-16 NOTE — Addendum Note (Signed)
Addended by: Mohammed Kindle on: 11/16/2022 10:03 AM     Modules accepted: Level of Service

## 2022-11-16 NOTE — Progress Notes (Addendum)
Subjective:sore throat      Patient ID: Danielle Rhodes       Pharyngitis  Associated symptoms: cough    Cough     The patient is a 32 y.o. female with sore throat and some congestion over past few days. Using otc meds no nv, some loose stools works in daycare some cough and sore throat. Using otc meds    Review of Systems   Respiratory:  Positive for cough.    : As noted in the HPI    History reviewed. No pertinent past medical history.     History reviewed. No pertinent surgical history.     No Known Allergies     No current outpatient medications on file.     No current facility-administered medications for this visit.        BP 98/64   Pulse 93   Temp 98.6 F (37 C) (Oral)   Resp 16   Ht 1.575 m (5\' 2" )   Wt 77.1 kg (170 lb)   LMP 11/13/2022 (Exact Date)   SpO2 99%   BMI 31.09 kg/m       Objective:   Physical Exam  Vitals reviewed.   Constitutional:       General: She is not in acute distress.     Appearance: Normal appearance. She is not ill-appearing, toxic-appearing or diaphoretic.   HENT:      Head: Normocephalic.      Right Ear: Tympanic membrane and ear canal normal.      Left Ear: Tympanic membrane and ear canal normal.      Nose: Nose normal.      Mouth/Throat:      Mouth: Mucous membranes are moist.      Pharynx: No oropharyngeal exudate or posterior oropharyngeal erythema.   Eyes:      General: No scleral icterus.        Right eye: No discharge.         Left eye: No discharge.      Extraocular Movements: Extraocular movements intact.      Conjunctiva/sclera: Conjunctivae normal.      Pupils: Pupils are equal, round, and reactive to light.   Cardiovascular:      Rate and Rhythm: Normal rate and regular rhythm.      Pulses: Normal pulses.      Heart sounds: Normal heart sounds.   Pulmonary:      Effort: Pulmonary effort is normal.      Breath sounds: Normal breath sounds.   Skin:     General: Skin is warm and dry.   Neurological:      General: No focal deficit present.      Mental Status: She  is alert.   Psychiatric:         Mood and Affect: Mood normal.         No scans are attached to the encounter.     Assessment:   1. Sore throat  -     POC Strep A Assay w/Optic (32440)       Plan:     Results for orders placed or performed in visit on 11/16/22   POC Strep A Assay w/Optic (10272)   Result Value Ref Range    Valid Internal Control, POC PASS     Group A Strep Antigen, POC Positive            Tx with otc meds tylenol motrin fluids fu as needed worsening symptoms. Fu as needed ,  tx for strep fu as needed    Mohammed Kindle, MD

## 2022-11-16 NOTE — Addendum Note (Signed)
Addended by: Mohammed Kindle on: 11/16/2022 10:07 AM     Modules accepted: Orders

## 2023-11-09 ENCOUNTER — Ambulatory Visit: Admit: 2023-11-09 | Discharge: 2023-11-09 | Attending: Family | Primary: Diagnostic Radiology

## 2023-11-09 VITALS — BP 116/76 | HR 96 | Temp 98.60000°F | Resp 18 | Ht 63.0 in | Wt 170.0 lb

## 2023-11-09 DIAGNOSIS — A084 Viral intestinal infection, unspecified: Secondary | ICD-10-CM

## 2023-11-09 LAB — AMB POC URINALYSIS DIP STICK AUTO W/O MICRO
Bilirubin, Urine, POC: NEGATIVE
Glucose, Urine, POC: NEGATIVE
Ketones, Urine, POC: NEGATIVE
Leukocyte Esterase, Urine, POC: NEGATIVE
Nitrite, Urine, POC: NEGATIVE
Protein, Urine, POC: 30
Specific Gravity, Urine, POC: 1.03 (ref 1.001–1.035)
Urobilinogen, POC: 0.2 mg/dL (ref <1.1)
pH, Urine, POC: 6 (ref 4.6–8.0)

## 2023-11-09 LAB — AMB POC URINE PREGNANCY TEST, VISUAL COLOR COMPARISON: HCG, Pregnancy, Urine, POC: NEGATIVE

## 2023-11-09 MED ORDER — ONDANSETRON 4 MG PO TBDP
4 | ORAL_TABLET | Freq: Four times a day (QID) | ORAL | 0 refills | 7.00000 days | Status: AC | PRN
Start: 2023-11-09 — End: 2023-11-12

## 2023-11-09 NOTE — Patient Instructions (Signed)
---  Suspect symptoms are due to viral gastroenteritis  ---Drink small amount of fluids frequently throughout the day and advance diet as tolerated.  May add diluted Gatorade and Pedialyte as needed  ---May use Tylenol and/or NSAIDs, like ibuprofen or naproxen, as needed for pain/fever with food  ---Follow-up next week if symptoms continue and go to ER anytime if condition worsens    ---Gastroenteritis education printed  ---Excuse given for today and tomorrow

## 2023-11-09 NOTE — Progress Notes (Signed)
 CHIEF COMPLAINT:  Chief Complaint   Patient presents with    Nausea    Diarrhea        HISTORY OF PRESENT ILLNESS:    Patient having subjective fever body aches nausea but no vomiting for the last 5 days.  Patient having diarrhea, approximately 4-5 episodes a day for the last 5 days.  No bloody or black stools.  Symptoms staying about the same.  No runny nose nasal congestion or cough.  No known sick contact.  Abdominal pain associate with GI symptoms.  No abnormal urinary symptoms.  No abnormal vaginal discharge or bleeding.       CURRENT MEDICATION LIST:    Current Outpatient Medications   Medication Sig Dispense Refill    ondansetron (ZOFRAN-ODT) 4 MG disintegrating tablet Take 1 tablet by mouth every 6 hours as needed for Vomiting or Nausea 12 tablet 0     No current facility-administered medications for this visit.        ALLERGIES:    No Known Allergies     HISTORY:  History reviewed. No pertinent past medical history.   History reviewed. No pertinent surgical history.   Social History     Socioeconomic History    Marital status: Single     Spouse name: Not on file    Number of children: Not on file    Years of education: Not on file    Highest education level: Not on file   Occupational History    Not on file   Tobacco Use    Smoking status: Never    Smokeless tobacco: Never   Vaping Use    Vaping status: Every Day   Substance and Sexual Activity    Alcohol use: Not Currently    Drug use: Never    Sexual activity: Not on file   Other Topics Concern    Not on file   Social History Narrative    Not on file     Social Drivers of Health     Financial Resource Strain: Not on file   Food Insecurity: Not on file   Transportation Needs: Not on file   Physical Activity: Not on file   Stress: Not on file   Social Connections: Not on file   Intimate Partner Violence: Not on file   Housing Stability: Not on file      History reviewed. No pertinent family history.     REVIEW OF SYSTEMS:  Review of Systems    Constitutional:  Positive for chills and fever.   Respiratory:  Negative for shortness of breath.    Cardiovascular:  Negative for chest pain.   Neurological:  Negative for dizziness and syncope.        PHYSICAL EXAM:  Physical Exam  Constitutional:       General: She is not in acute distress.     Appearance: Normal appearance. She is not toxic-appearing or diaphoretic.   HENT:      Head: Normocephalic and atraumatic.      Ears:      Comments: External ear canals normal.  Bilateral Tms clear/intact, with clear effusion. No mastoid tenderness or swelling.     Nose: Nose normal. No congestion or rhinorrhea.      Mouth/Throat:      Pharynx: Oropharynx is clear.   Eyes:      Conjunctiva/sclera: Conjunctivae normal.   Cardiovascular:      Rate and Rhythm: Normal rate and regular rhythm.   Pulmonary:  Effort: Pulmonary effort is normal.      Breath sounds: Normal breath sounds.   Abdominal:      General: Bowel sounds are normal. There is no distension.      Palpations: Abdomen is soft. There is no mass.      Tenderness: There is no right CVA tenderness, left CVA tenderness, guarding or rebound.      Comments: Generalized tenderness with deep palpation.  Negative heel strike   Musculoskeletal:         General: Normal range of motion.      Cervical back: Normal range of motion and neck supple. No rigidity or tenderness.   Lymphadenopathy:      Cervical: No cervical adenopathy.   Skin:     General: Skin is warm and dry.   Neurological:      General: No focal deficit present.      Mental Status: She is alert and oriented to person, place, and time.      Sensory: No sensory deficit.      Motor: No weakness.      Coordination: Coordination normal.      Gait: Gait normal.   Psychiatric:         Mood and Affect: Mood normal.         Behavior: Behavior normal.         Thought Content: Thought content normal.         Judgment: Judgment normal.        Vital Signs -   Visit Vitals  BP 116/76   Pulse 96   Temp 98.6 F (37 C)  (Oral)   Resp 18   Ht 1.6 m (5\' 3" )   Wt 77.1 kg (170 lb)   SpO2 100%   BMI 30.11 kg/m            LABS  Results for orders placed or performed in visit on 11/09/23   AMB POC URINALYSIS DIP STICK AUTO W/O MICRO   Result Value Ref Range    Color (UA POC) Yellow     Clarity (UA POC) Clear     Glucose, Urine, POC Negative     Bilirubin, Urine, POC Negative     Ketones, Urine, POC Negative     Specific Gravity, Urine, POC 1.03 1.001 - 1.035    Blood (UA POC) Large     pH, Urine, POC 6.0 4.6 - 8.0    Protein, Urine, POC 30     Urobilinogen, POC 0.2 mg/dL <1.6 mg/dL    Nitrite, Urine, POC Negative Negative    Leukocyte Esterase, Urine, POC Negative    POC Urine Pregnancy Test (10960)   Result Value Ref Range    Valid Internal Control, POC PASS     HCG, Pregnancy, Urine, POC Negative          TREATMENT:    1. Viral gastroenteritis  2. Nausea  -     AMB POC URINALYSIS DIP STICK AUTO W/O MICRO  -     POC Urine Pregnancy Test (81025)  -     ondansetron (ZOFRAN-ODT) 4 MG disintegrating tablet; Take 1 tablet by mouth every 6 hours as needed for Vomiting or Nausea, Disp-12 tablet, R-0Normal  3. Diarrhea, unspecified type  4. Subjective fever  5. Body aches       EDUCATION:   Patient Instructions     ---Suspect symptoms are due to viral gastroenteritis  ---Drink small amount of fluids frequently throughout the day and advance  diet as tolerated.  May add diluted Gatorade and Pedialyte as needed  ---May use Tylenol and/or NSAIDs, like ibuprofen or naproxen, as needed for pain/fever with food  ---Follow-up next week if symptoms continue and go to ER anytime if condition worsens    ---Gastroenteritis education printed  ---Excuse given for today and tomorrow        Follow up and Dispositions:  Return if symptoms worsen or fail to improve.       Charolett Copes, APRN - NP

## 2024-02-15 ENCOUNTER — Encounter

## 2024-02-15 ENCOUNTER — Ambulatory Visit: Admit: 2024-02-15 | Discharge: 2024-02-15 | Attending: Family | Primary: Diagnostic Radiology

## 2024-02-15 VITALS — BP 111/73 | HR 95 | Temp 98.50000°F | Resp 17 | Ht 63.0 in | Wt 174.1 lb

## 2024-02-15 DIAGNOSIS — F419 Anxiety disorder, unspecified: Principal | ICD-10-CM

## 2024-02-15 LAB — CBC WITH AUTO DIFFERENTIAL
Basophils %: 0.5 % (ref 0.0–2.0)
Basophils Absolute: 0 x10e3/mcL (ref 0.0–0.2)
Eosinophils %: 1.4 % (ref 0.0–7.0)
Eosinophils Absolute: 0.1 x10e3/mcL (ref 0.0–0.5)
Hematocrit: 34.3 % (ref 34.0–47.0)
Hemoglobin: 11 g/dL — ABNORMAL LOW (ref 11.5–15.7)
Immature Grans (Abs): 0.01 x10e3/mcL (ref 0.00–0.06)
Immature Granulocytes %: 0.2 % (ref 0.0–0.6)
Lymphocytes Absolute: 2.2 x10e3/mcL (ref 1.0–3.2)
Lymphocytes: 38.7 % (ref 15.0–45.0)
MCH: 27 pg (ref 27.0–34.5)
MCHC: 32.1 g/dL (ref 30.0–36.0)
MCV: 84.1 fL (ref 81.0–99.0)
MPV: 11.1 fL (ref 7.0–12.2)
Monocytes %: 9.9 % (ref 4.0–12.0)
Monocytes Absolute: 0.6 x10e3/mcL (ref 0.3–1.0)
NRBC Absolute: 0 x10e3/mcL (ref 0.000–0.012)
NRBC Automated: 0 % (ref 0.0–0.2)
Neutrophils %: 49.3 % (ref 42.0–74.0)
Neutrophils Absolute: 2.8 x10e3/mcL (ref 1.6–7.3)
Platelets: 332 x10e3/mcL (ref 140–440)
RBC: 4.08 x10e6/mcL (ref 3.60–5.20)
RDW: 14.6 % (ref 10.0–17.0)
WBC: 5.6 x10e3/mcL (ref 3.8–10.6)

## 2024-02-15 LAB — AMB POC COMPREHENSIVE METABOLIC PANEL
ALT (SGPT) POC: 19 U/L (ref 7–?)
AST (SGOT) POC: 26 (ref 13–?)
Albumin, POC: 3.9
Alkaline Phos, POC: 65 mg/dL
BUN, POC: 9 mg/dL
CO2, POC: 29 meq/L
Calcium, POC: 10 mg/dL
Chloride, POC: 104 mmol/L
Creatinine, POC: 0.6 mg/dL
Glucose, POC: 100 mg/dL
Potassium, POC: 4.8 mmol/L
Sodium, POC: 139 mmol/L
Total Bilirubin, POC: 0.8 mg/dL
Total Protein, POC: 8.2 g/dL

## 2024-02-15 LAB — AMB POC URINALYSIS DIP STICK AUTO W/O MICRO
Bilirubin, Urine, POC: NEGATIVE
Blood (UA POC): NEGATIVE
Glucose, Urine, POC: NEGATIVE
Ketones, Urine, POC: NEGATIVE
Leukocyte Esterase, Urine, POC: NEGATIVE
Nitrite, Urine, POC: NEGATIVE
Protein, Urine, POC: 30
Specific Gravity, Urine, POC: 1.025 (ref 1.001–1.035)
Urobilinogen, POC: 0.2 mg/dL (ref <1.1)
pH, Urine, POC: 7.5 (ref 4.6–8.0)

## 2024-02-15 MED ORDER — KETOROLAC TROMETHAMINE 30 MG/ML IJ SOLN
30 | Freq: Once | INTRAMUSCULAR | Status: AC
Start: 2024-02-15 — End: 2024-02-15
  Administered 2024-02-15: 18:00:00 60 mg via INTRAMUSCULAR

## 2024-02-15 MED ORDER — HYDROXYZINE HCL 50 MG PO TABS
50 | ORAL_TABLET | Freq: Three times a day (TID) | ORAL | 0 refills | 29.00000 days | Status: AC | PRN
Start: 2024-02-15 — End: 2024-02-25

## 2024-02-15 MED ORDER — MECLIZINE HCL 25 MG PO TABS
25 | ORAL_TABLET | Freq: Three times a day (TID) | ORAL | 0 refills | 10.00000 days | Status: AC | PRN
Start: 2024-02-15 — End: 2024-02-25

## 2024-02-15 NOTE — Progress Notes (Signed)
 Danielle Rhodes (DOB:  1991-03-28) is a 33 y.o. female,Established patient, here for evaluation of the following chief complaint(s):  Dizziness (Started in May.), Blurred Vision (Happens after she eats food. Both parents have diabetes type 2. ), and Headache      History of Present Illness:     32 year old female presents with complaints of dizziness starting in May.  Intermittent blurry vision after she eats food and headache.  Patient has a strong family history of diabetes type 2.  Patient is very nervous that she may have it as well.  She has not seen a primary care physician in quite a while.  Has no insurance.  History reviewed. No pertinent past medical history.   History reviewed. No pertinent surgical history.   Current Outpatient Medications   Medication Sig Dispense Refill    hydrOXYzine  HCl (ATARAX ) 50 MG tablet Take 0.5-1 tablets by mouth every 8 hours as needed for Anxiety 30 tablet 0    meclizine  (ANTIVERT ) 25 MG tablet Take 1 tablet by mouth 3 times daily as needed for Dizziness 30 tablet 0     Current Facility-Administered Medications   Medication Dose Route Frequency Provider Last Rate Last Admin    ketorolac  (TORADOL ) injection 60 mg  60 mg IntraMUSCular Once           No Known Allergies    Vitals:    02/15/24 1300   BP: 111/73   Pulse: 95   Resp: 17   Temp: 98.5 F (36.9 C)   TempSrc: Oral   SpO2: 99%   Weight: 79 kg (174 lb 1.6 oz)   Height: 1.6 m (5' 3)         Physical Exam:  Physical Exam  Vitals and nursing note reviewed.   Constitutional:       Appearance: Normal appearance.   HENT:      Head: Normocephalic and atraumatic.      Right Ear: Tympanic membrane and ear canal normal.      Left Ear: Tympanic membrane and ear canal normal.   Cardiovascular:      Rate and Rhythm: Normal rate and regular rhythm.   Pulmonary:      Effort: Pulmonary effort is normal.   Neurological:      Mental Status: She is alert and oriented to person, place, and time. Mental status is at baseline.   Psychiatric:          Mood and Affect: Mood normal.         Behavior: Behavior normal.              ASSESSMENT/PLAN:    ICD-10-CM    1. Anxiety  F41.9 hydrOXYzine  HCl (ATARAX ) 50 MG tablet      2. Dizziness  R42 POC Urinalysis, Auto W/O Scope (81003)     POC Comprehensive Metabolic Panel ([facility])     COLLECTION VENOUS BLOOD,VENIPUNCTURE     meclizine  (ANTIVERT ) 25 MG tablet      3. Acute migraine  G43.909 ketorolac  (TORADOL ) injection 60 mg      4. Fatigue, unspecified type  R53.83 Cbc With Auto Differential     TSH reflex to FT4 ([facility])     COLLECTION VENOUS BLOOD,VENIPUNCTURE           1. Anxiety  -     hydrOXYzine  HCl (ATARAX ) 50 MG tablet; Take 0.5-1 tablets by mouth every 8 hours as needed for Anxiety, Disp-30 tablet, R-0Normal  2. Dizziness  -  POC Urinalysis, Auto W/O Scope (81003)  -     POC Comprehensive Metabolic Panel ([facility])  -     COLLECTION VENOUS BLOOD,VENIPUNCTURE  -     meclizine  (ANTIVERT ) 25 MG tablet; Take 1 tablet by mouth 3 times daily as needed for Dizziness, Disp-30 tablet, R-0Normal  3. Acute migraine  -     ketorolac  (TORADOL ) injection 60 mg; 60 mg, IntraMUSCular, ONCE, 1 dose, On Tue 02/15/24 at 1400Do not administer for more than 5 days  4. Fatigue, unspecified type  -     Cbc With Auto Differential; Future  -     TSH reflex to FT4 ([facility]); Future  -     COLLECTION VENOUS BLOOD,VENIPUNCTURE      Visit Diagnoses         Codes      Anxiety    -  Primary F41.9      Dizziness     R42      Acute migraine     G43.909      Fatigue, unspecified type     R53.83             Results for orders placed or performed in visit on 02/15/24   COLLECTION VENOUS BLOOD,VENIPUNCTURE    Narrative    VERBAL CONSENT OBTAINED  SITE CLEANSED WITH ASEPTIC TECHNIQUE  BUTTERFLY NEEDLE USED   1  SUCCESSFUL ATTEMPT  SITE: RAC   3 TUBES COLLECTED   PRESSURE APPLIED AT SITE  PATIENT TOLERATED WELL  LABELED SPECIMEN SENT TO LAB , CMP IN HOUSE/CBC/TSH     POC Urinalysis, Auto W/O Scope (81003)   Result Value Ref  Range    Color (UA POC) Yellow     Clarity (UA POC) Clear     Glucose, Urine, POC Negative     Bilirubin, Urine, POC Negative     Ketones, Urine, POC Negative     Specific Gravity, Urine, POC 1.025 1.001 - 1.035    Blood (UA POC) Negative     pH, Urine, POC 7.5 4.6 - 8.0    Protein, Urine, POC 30     Urobilinogen, POC 0.2 mg/dL <8.8 mg/dL    Nitrite, Urine, POC Negative Negative    Leukocyte Esterase, Urine, POC Negative    POC Comprehensive Metabolic Panel ([facility])   Result Value Ref Range    Sodium, POC 139 MMOL/L    Potassium, POC 4.8 MMOL/L    Chloride, POC 104 MMOL/L    CO2, POC 29 mEq/L    Glucose, POC 100 MG/DL    BUN, POC 9 MG/DL    Creatinine, POC 0.6 MG/DL    Albumin, POC 3.9     Alkaline Phos, POC 65 mg/dL    AST (SGOT) POC 26 >=86    ALT (SGPT) POC 19 >=7 U/L    Total Bilirubin, POC 0.8 mg/dL    Total Protein, POC 8.2 g/dL    Calcium, POC 89.9 mg/dL              Toradol  given in clinic for migraine.  Urine negative in clinic.  CMP shows no acute abnormalities.  Blood glucose 100. Will send out CBC and thyroid levels as well for a more thorough workup.  If abnormalities are found patient must find a primary care physician to manage these abnormalities.  Patient advised to return as needed.      Return if symptoms worsen or fail to improve.      Electronically signed  by:  --Greig KATHEE Breslow, APRN - NP

## 2024-02-16 LAB — TSH REFLEX TO FT4: TSH: 0.977 u[IU]/mL (ref 0.358–3.740)

## 2024-02-16 NOTE — Other (Signed)
 Please advise patient that her complete blood count was normal except her hemoglobin is slightly low at 11; and her TSH, her thyroid test was normal.

## 2024-04-11 ENCOUNTER — Ambulatory Visit: Admit: 2024-04-11 | Discharge: 2024-04-11 | Attending: Family Medicine | Primary: Diagnostic Radiology

## 2024-04-11 VITALS — BP 126/78 | HR 103 | Temp 98.70000°F | Resp 17 | Ht 62.5 in | Wt 170.0 lb

## 2024-04-11 DIAGNOSIS — J029 Acute pharyngitis, unspecified: Principal | ICD-10-CM

## 2024-04-11 LAB — AMB POC INFLUENZA ASSAY W/OPTIC
Flu A Antigen: NEGATIVE
Flu B Antigen: NEGATIVE

## 2024-04-11 LAB — AMB POC RAPID STREP A: Group A Strep Antigen, POC: NEGATIVE

## 2024-04-11 LAB — POCT COVID-19 ANTIGEN CARD
Lot Number: 926531
SARS-COV-2, POC: NOT DETECTED

## 2024-04-11 NOTE — Progress Notes (Signed)
 "Subjective:sore throat      Patient ID: Danielle Rhodes     HPI The patient is a 33 y.o. female with sore throat congestion and drainage over past few days no nvd no rash no fever using otc meds    Review of Systems: As noted in the HPI    Past Medical History:   Diagnosis Date    Anemia         History reviewed. No pertinent surgical history.     No Known Allergies     No current outpatient medications on file.     No current facility-administered medications for this visit.        BP 122/84 (BP Site: Left Upper Arm, Patient Position: Sitting, BP Cuff Size: Large Adult)   Pulse (!) 103   Temp 98.7 F (37.1 C) (Oral)   Resp 17   Ht 1.588 m (5' 2.5)   Wt 77.1 kg (170 lb)   SpO2 99%   BMI 30.60 kg/m       Objective:   Physical Exam  Constitutional:       General: She is not in acute distress.     Appearance: Normal appearance. She is not ill-appearing, toxic-appearing or diaphoretic.   HENT:      Head: Normocephalic and atraumatic.      Right Ear: Tympanic membrane and ear canal normal.      Left Ear: Tympanic membrane and ear canal normal.      Nose: Nose normal.      Mouth/Throat:      Mouth: Mucous membranes are moist.      Pharynx: Posterior oropharyngeal erythema present. No oropharyngeal exudate.   Eyes:      General: No scleral icterus.        Right eye: No discharge.         Left eye: No discharge.      Extraocular Movements: Extraocular movements intact.      Conjunctiva/sclera: Conjunctivae normal.      Pupils: Pupils are equal, round, and reactive to light.   Cardiovascular:      Rate and Rhythm: Normal rate and regular rhythm.      Pulses: Normal pulses.      Heart sounds: Normal heart sounds.   Pulmonary:      Effort: Pulmonary effort is normal.      Breath sounds: Normal breath sounds.   Abdominal:      General: There is no distension.      Palpations: There is no mass.      Tenderness: There is no abdominal tenderness. There is no right CVA tenderness or left CVA tenderness.      Hernia: No  hernia is present.   Skin:     General: Skin is warm and dry.   Neurological:      General: No focal deficit present.      Mental Status: She is alert.   Psychiatric:         Mood and Affect: Mood normal.         No scans are attached to the encounter.     Assessment:   1. Sore throat  -     POCT COVID-19 Antigen Card ([facility])  -     POC Influenza Assay w/Optic (12195)  -     POC Strep A Assay w/Optic (12119)       Plan:   No results found for any visits on 04/11/24.     Reviewed labs tx  as viral syndrome fu as needed       Belvie LOISE Pump, MD  "
